# Patient Record
Sex: Female | Born: 1990
Health system: Southern US, Community
[De-identification: ages and names within clinical notes are randomized; demographics above are authoritative.]

## PROBLEM LIST (undated history)

## (undated) DIAGNOSIS — T8859XA Other complications of anesthesia, initial encounter: Secondary | ICD-10-CM

## (undated) DIAGNOSIS — T4145XA Adverse effect of unspecified anesthetic, initial encounter: Secondary | ICD-10-CM

## (undated) DIAGNOSIS — E039 Hypothyroidism, unspecified: Secondary | ICD-10-CM

## (undated) DIAGNOSIS — D6851 Activated protein C resistance: Secondary | ICD-10-CM

## (undated) DIAGNOSIS — J45909 Unspecified asthma, uncomplicated: Secondary | ICD-10-CM

## (undated) HISTORY — DX: Unspecified asthma, uncomplicated: J45.909

## (undated) HISTORY — PX: ABDOMINAL HYSTERECTOMY: SHX81

## (undated) HISTORY — PX: TYMPANOSTOMY TUBE PLACEMENT: SHX32

---
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
## 2011-11-08 HISTORY — PX: INGUINAL HERNIA REPAIR: SUR1180

## 2012-11-07 HISTORY — PX: COLONOSCOPY: SHX174

## 2016-08-25 DIAGNOSIS — Z23 Encounter for immunization: Secondary | ICD-10-CM | POA: Diagnosis not present

## 2016-08-25 DIAGNOSIS — R634 Abnormal weight loss: Secondary | ICD-10-CM | POA: Diagnosis not present

## 2016-08-25 DIAGNOSIS — R1114 Bilious vomiting: Secondary | ICD-10-CM | POA: Diagnosis not present

## 2016-08-25 DIAGNOSIS — E063 Autoimmune thyroiditis: Secondary | ICD-10-CM | POA: Diagnosis not present

## 2016-09-20 DIAGNOSIS — K219 Gastro-esophageal reflux disease without esophagitis: Secondary | ICD-10-CM | POA: Diagnosis not present

## 2016-10-14 DIAGNOSIS — N926 Irregular menstruation, unspecified: Secondary | ICD-10-CM | POA: Diagnosis not present

## 2017-03-09 DIAGNOSIS — J209 Acute bronchitis, unspecified: Secondary | ICD-10-CM | POA: Diagnosis not present

## 2017-03-09 DIAGNOSIS — E063 Autoimmune thyroiditis: Secondary | ICD-10-CM | POA: Diagnosis not present

## 2017-05-11 DIAGNOSIS — K529 Noninfective gastroenteritis and colitis, unspecified: Secondary | ICD-10-CM | POA: Diagnosis not present

## 2017-05-11 DIAGNOSIS — J4520 Mild intermittent asthma, uncomplicated: Secondary | ICD-10-CM | POA: Diagnosis not present

## 2017-05-11 DIAGNOSIS — E063 Autoimmune thyroiditis: Secondary | ICD-10-CM | POA: Diagnosis not present

## 2017-05-11 DIAGNOSIS — J302 Other seasonal allergic rhinitis: Secondary | ICD-10-CM | POA: Diagnosis not present

## 2017-11-13 DIAGNOSIS — Z0000 Encounter for general adult medical examination without abnormal findings: Secondary | ICD-10-CM | POA: Diagnosis not present

## 2017-11-13 DIAGNOSIS — E063 Autoimmune thyroiditis: Secondary | ICD-10-CM | POA: Diagnosis not present

## 2017-11-13 DIAGNOSIS — Z23 Encounter for immunization: Secondary | ICD-10-CM | POA: Diagnosis not present

## 2017-11-13 DIAGNOSIS — Z13220 Encounter for screening for lipoid disorders: Secondary | ICD-10-CM | POA: Diagnosis not present

## 2017-12-07 DIAGNOSIS — N94819 Vulvodynia, unspecified: Secondary | ICD-10-CM | POA: Diagnosis not present

## 2017-12-07 DIAGNOSIS — Z01411 Encounter for gynecological examination (general) (routine) with abnormal findings: Secondary | ICD-10-CM | POA: Diagnosis not present

## 2017-12-07 DIAGNOSIS — N939 Abnormal uterine and vaginal bleeding, unspecified: Secondary | ICD-10-CM | POA: Diagnosis not present

## 2017-12-07 DIAGNOSIS — D6851 Activated protein C resistance: Secondary | ICD-10-CM | POA: Diagnosis not present

## 2018-01-24 DIAGNOSIS — N939 Abnormal uterine and vaginal bleeding, unspecified: Secondary | ICD-10-CM | POA: Diagnosis not present

## 2018-02-07 ENCOUNTER — Encounter (HOSPITAL_BASED_OUTPATIENT_CLINIC_OR_DEPARTMENT_OTHER): Payer: Self-pay | Admitting: *Deleted

## 2018-02-07 ENCOUNTER — Other Ambulatory Visit: Payer: Self-pay

## 2018-02-07 DIAGNOSIS — N926 Irregular menstruation, unspecified: Secondary | ICD-10-CM | POA: Diagnosis not present

## 2018-02-09 NOTE — H&P (Signed)
27yo G0 who presents for Hysteroscopy, D&C, possible myosure resection.  In review, she reports that for as long as she can remember, her periods have been irregular. Sometimes they will be every 2 weeks, sometimes every 4- 6weeks, it really just depends. However, over the past few months it seems to have gotten worse- with more frequent menses. Due to her h/o FVL, she had tried progesterone only pills and had a horrible reaction- noted worsening bleeding, bloating and weight gain. At this point, it has been something that she has just dealt with; however, recently she has been bleeding since November, which is the longest she has ever gone. Bleeding will maybe stop for 1-2 days, but mostly she will have moderate bleeding. The longest heavy bleeding last for about 2 weeks. 2) Dyspareunia: Pt reports long standing h/o pain with any type of penetration. Pain is mostly on the outside. She has never tried physical therapy or other conservative options. Denies vaginal discharge, itching or irritation.   Unable to complete SHG- could not get pass cervical os due to discomfort. US: 7.9cm anteverted uterus with thickened endometrium ?hyperechoic mass fundal wall 0.9cm, avascular. Bilateral ovaries unremarkable.  Current Medications  Taking   Allegra(Fexofenadine HCl) as needed   Albuterol Sulfate HFA 108 (90 Base) MCG/ACT Aerosol Solution 2 puffs as needed Inhalation every 4 hrs, Notes: prn   Pepcid AC(Famotidine) 10 MG Tablet 1 tablet as needed Orally Twice a day   Synthroid(L-Thyroxine Sodium) 50 MCG Tablet 1 tablet on an empty stomach in the morning Orally Once a day   Xanax(ALPRAZolam) 0.25 MG Tablet 1 tablet Orally once   Unknown   Omeprazole 20 mg tablet one tablet orally twice a day   Medication List reviewed and reconciled with the patient    Past Medical History  Hypothryoidism.   Factor V Leiden.   Asthma.    Surgical History  wisdom teeth 2008  right inguinal hernia 2013   endoscopy/colonoscopy 2015   Family History  Father: high cholesterol, diagnosed with Hypertension  Mother: high cholesterol, , melanoma, Hypertension  Maternal Grand Father: MI (5650's)  Maternal Grand Mother: MI 63(50's)  Paternal Grand Mother: Lung Cancer  Negative for GI\\\/Cardiac\\\/GYN in immediate family.   Social History  General:  Tobacco use  cigarettes: Former smoker tobacco pipe- only smoked for a year Quit in year 2013 Tobacco history last updated 11/13/2017 Alcohol: Daily, beer.  Marital Status: Engaged on Christmas 2018. To Judeth CornfieldStephanie and denies abuse. .  Seat belt use: yes.    Gyn History  Sexual activity currently sexually active.  Periods : irregular, on going pd since November 2018.  LMP 09/18/2017 and hasn't stopped.  Birth control same sex relationship.  Last pap smear date Unsure.  Abnormal pap smear no.  STD none.  Menarche 12.    OB History  Never been pregnant per patient.    Allergies  Vicodin: stomach upset   Hospitalization/Major Diagnostic Procedure  Concussions - 6 in high school, 1 in college    Review of Systems  CONSTITUTIONAL:  no Fatigue. no Fever. no Weight gain. no Weight loss.  HEENT:  no nasal congestion. no Visual changes.  CARDIOLOGY:  no Chest pain. no Dyspnea on exertion. no Edema. no Palpitations.  RESPIRATORY:  no Shortness of breath. no Cough. no Hemoptysis.  UROLOGY:  no Pain with urination. no Urinary urgency. no Urinary frequency. no Urinary incontinence.  GASTROENTEROLOGY:  no Change in bowel habits. no Constipation. no Diarrhea. no Incontinence of stool.  FEMALE  REPRODUCTIVE:  no Abnormal vaginal bleeding. no Abnormal vaginal discharge. no Breast pain. no Breast tenderness. no Hot flashes. no Vaginal irritation. no Vaginal itching.  NEUROLOGY:  no Dizziness. no Fainting. no Headache.  PSYCHOLOGY:  no Anxiety. no Depression.  SKIN:  no Rash. no Hives.  ENDOCRINOLOGY:  no Cold intolerance. no Excessive thirst.  no Excessive urination.     O: Examination in office: General Examination: CONSTITUTIONAL: well developed and well nourished.  SKIN: moist, warm, normal, no rash.  HEENT: within normal limits.  NECK: normal appearance, no thyromegaly.  LUNGS: regular breathing rate and effort.  ABDOMEN: soft and not tender, no masses palpated, no rebound, no rigidity, no guarding.  FEMALE GENITOURINARY: normal external genitalia, no tenderness to external genitalia, +tenderness with insertion of speculum, cervix visualized,vagina - pink moist mucosa, no lesions or abnormal discharge,cervix - no discharge or lesions.  EXTREMITIES: no edema present.  NEUROLOGIC EXAM: alert and oriented.  LYMPH NODES: no axillary adenopathy, no inguinal adenopathy.   A/P: 27yo G0 who presents for hysteroscopy, D&C, possible myosure ablation due to AUB -NPO -LR @ 125cc/hr -SCDs to OR -Risk/benefit and alternatives reviewed including but not limited to risk of bleeding, infection and uterine perforation.  Questions and concerns were addressed and she desires to proceed  Myna Hidalgo, DO [phone] (cell) [phone] (office)

## 2018-02-13 ENCOUNTER — Encounter (HOSPITAL_BASED_OUTPATIENT_CLINIC_OR_DEPARTMENT_OTHER)
Admission: RE | Admit: 2018-02-13 | Discharge: 2018-02-13 | Disposition: A | Discharge status: Home / Self Care | Payer: BLUE CROSS/BLUE SHIELD | Source: Ambulatory Visit | Attending: Obstetrics & Gynecology | Admitting: Obstetrics & Gynecology

## 2018-02-13 DIAGNOSIS — Z79899 Other long term (current) drug therapy: Secondary | ICD-10-CM | POA: Diagnosis not present

## 2018-02-13 DIAGNOSIS — Z885 Allergy status to narcotic agent status: Secondary | ICD-10-CM | POA: Diagnosis not present

## 2018-02-13 DIAGNOSIS — J45909 Unspecified asthma, uncomplicated: Secondary | ICD-10-CM | POA: Diagnosis not present

## 2018-02-13 DIAGNOSIS — D6851 Activated protein C resistance: Secondary | ICD-10-CM | POA: Diagnosis not present

## 2018-02-13 DIAGNOSIS — N939 Abnormal uterine and vaginal bleeding, unspecified: Secondary | ICD-10-CM | POA: Diagnosis not present

## 2018-02-13 DIAGNOSIS — Z87891 Personal history of nicotine dependence: Secondary | ICD-10-CM | POA: Diagnosis not present

## 2018-02-13 DIAGNOSIS — E039 Hypothyroidism, unspecified: Secondary | ICD-10-CM | POA: Diagnosis not present

## 2018-02-13 DIAGNOSIS — N840 Polyp of corpus uteri: Secondary | ICD-10-CM | POA: Diagnosis not present

## 2018-02-13 LAB — POCT PREGNANCY, URINE: Preg Test, Ur: NEGATIVE

## 2018-02-13 LAB — BASIC METABOLIC PANEL
Anion gap: 10 (ref 5–15)
BUN: 12 mg/dL (ref 6–20)
CO2: 23 mmol/L (ref 22–32)
Calcium: 9.5 mg/dL (ref 8.9–10.3)
Chloride: 103 mmol/L (ref 101–111)
Creatinine, Ser: 0.85 mg/dL (ref 0.44–1.00)
GFR calc Af Amer: >60 mL/min (ref >60)
GFR calc non Af Amer: >60 mL/min (ref >60)
Glucose, Bld: 89 mg/dL (ref 65–99)
Potassium: 4.6 mmol/L (ref 3.5–5.1)
Sodium: 136 mmol/L (ref 135–145)

## 2018-02-13 LAB — CBC
HCT: 42 % (ref 36.0–46.0)
Hemoglobin: 14.1 g/dL (ref 12.0–15.0)
MCH: 32.1 pg (ref 26.0–34.0)
MCHC: 33.6 g/dL (ref 30.0–36.0)
MCV: 95.7 fL (ref 78.0–100.0)
Platelets: 240 10*3/uL (ref 150–400)
RBC: 4.39 MIL/uL (ref 3.87–5.11)
RDW: 12.1 % (ref 11.5–15.5)
WBC: 6.9 10*3/uL (ref 4.0–10.5)

## 2018-02-14 ENCOUNTER — Ambulatory Visit (HOSPITAL_BASED_OUTPATIENT_CLINIC_OR_DEPARTMENT_OTHER): Payer: BLUE CROSS/BLUE SHIELD | Admitting: Anesthesiology

## 2018-02-14 ENCOUNTER — Ambulatory Visit (HOSPITAL_BASED_OUTPATIENT_CLINIC_OR_DEPARTMENT_OTHER)
Admission: RE | Admit: 2018-02-14 | Discharge: 2018-02-14 | Disposition: A | Discharge status: Home / Self Care | Payer: BLUE CROSS/BLUE SHIELD | Source: Ambulatory Visit | Attending: Obstetrics & Gynecology | Admitting: Obstetrics & Gynecology

## 2018-02-14 ENCOUNTER — Encounter (HOSPITAL_BASED_OUTPATIENT_CLINIC_OR_DEPARTMENT_OTHER)
Admission: RE | Disposition: A | Discharge status: Home / Self Care | Payer: Self-pay | Source: Ambulatory Visit | Attending: Obstetrics & Gynecology

## 2018-02-14 ENCOUNTER — Encounter (HOSPITAL_BASED_OUTPATIENT_CLINIC_OR_DEPARTMENT_OTHER): Payer: Self-pay | Admitting: Anesthesiology

## 2018-02-14 DIAGNOSIS — Z79899 Other long term (current) drug therapy: Secondary | ICD-10-CM | POA: Insufficient documentation

## 2018-02-14 DIAGNOSIS — E039 Hypothyroidism, unspecified: Secondary | ICD-10-CM | POA: Insufficient documentation

## 2018-02-14 DIAGNOSIS — N939 Abnormal uterine and vaginal bleeding, unspecified: Secondary | ICD-10-CM | POA: Diagnosis not present

## 2018-02-14 DIAGNOSIS — J45909 Unspecified asthma, uncomplicated: Secondary | ICD-10-CM | POA: Insufficient documentation

## 2018-02-14 DIAGNOSIS — N840 Polyp of corpus uteri: Secondary | ICD-10-CM | POA: Insufficient documentation

## 2018-02-14 DIAGNOSIS — Z885 Allergy status to narcotic agent status: Secondary | ICD-10-CM | POA: Diagnosis not present

## 2018-02-14 DIAGNOSIS — Z87891 Personal history of nicotine dependence: Secondary | ICD-10-CM | POA: Diagnosis not present

## 2018-02-14 DIAGNOSIS — D6851 Activated protein C resistance: Secondary | ICD-10-CM | POA: Insufficient documentation

## 2018-02-14 HISTORY — DX: Hypothyroidism, unspecified: E03.9

## 2018-02-14 HISTORY — DX: Adverse effect of unspecified anesthetic, initial encounter: T41.45XA

## 2018-02-14 HISTORY — DX: Other complications of anesthesia, initial encounter: T88.59XA

## 2018-02-14 HISTORY — PX: DILATATION & CURETTAGE/HYSTEROSCOPY WITH MYOSURE: SHX6511

## 2018-02-14 HISTORY — DX: Activated protein C resistance: D68.51

## 2018-02-14 SURGERY — DILATATION & CURETTAGE/HYSTEROSCOPY WITH MYOSURE
Anesthesia: General

## 2018-02-14 MED ORDER — PROPOFOL 10 MG/ML IV BOLUS
INTRAVENOUS | Status: AC
  Filled 2018-02-14: qty 20

## 2018-02-14 MED ORDER — PROPOFOL 10 MG/ML IV BOLUS
INTRAVENOUS | Status: DC | PRN
  Administered 2018-02-14: 150 mg via INTRAVENOUS

## 2018-02-14 MED ORDER — FENTANYL CITRATE (PF) 100 MCG/2ML IJ SOLN
25.0000 ug | INTRAMUSCULAR | Status: DC | PRN
  Administered 2018-02-14: 50 ug via INTRAVENOUS

## 2018-02-14 MED ORDER — ONDANSETRON HCL 4 MG/2ML IJ SOLN
INTRAMUSCULAR | Status: DC | PRN
  Administered 2018-02-14: 4 mg via INTRAVENOUS

## 2018-02-14 MED ORDER — LIDOCAINE HCL (CARDIAC) 20 MG/ML IV SOLN
INTRAVENOUS | Status: DC | PRN
  Administered 2018-02-14: 60 mg via INTRAVENOUS

## 2018-02-14 MED ORDER — SILVER NITRATE-POT NITRATE 75-25 % EX MISC
CUTANEOUS | Status: AC
  Filled 2018-02-14: qty 4

## 2018-02-14 MED ORDER — LACTATED RINGERS IV SOLN: INTRAVENOUS | Status: DC

## 2018-02-14 MED ORDER — FENTANYL CITRATE (PF) 100 MCG/2ML IJ SOLN
INTRAMUSCULAR | Status: AC
  Filled 2018-02-14: qty 2

## 2018-02-14 MED ORDER — FENTANYL CITRATE (PF) 100 MCG/2ML IJ SOLN
INTRAMUSCULAR | Status: DC | PRN
  Administered 2018-02-14 (×2): 50 ug via INTRAVENOUS

## 2018-02-14 MED ORDER — MIDAZOLAM HCL 2 MG/2ML IJ SOLN
INTRAMUSCULAR | Status: AC
  Filled 2018-02-14: qty 2

## 2018-02-14 MED ORDER — ONDANSETRON HCL 4 MG/2ML IJ SOLN
INTRAMUSCULAR | Status: AC
  Filled 2018-02-14: qty 2

## 2018-02-14 MED ORDER — SODIUM CHLORIDE 0.9 % IR SOLN
Status: DC | PRN
  Administered 2018-02-14: 3000 mL

## 2018-02-14 MED ORDER — LIDOCAINE-EPINEPHRINE 1 %-1:100000 IJ SOLN
INTRAMUSCULAR | Status: DC | PRN
  Administered 2018-02-14: 20 mL

## 2018-02-14 MED ORDER — LACTATED RINGERS IV SOLN
INTRAVENOUS | Status: DC
  Administered 2018-02-14 (×2): via INTRAVENOUS

## 2018-02-14 MED ORDER — LIDOCAINE-EPINEPHRINE 1 %-1:100000 IJ SOLN
INTRAMUSCULAR | Status: AC
  Filled 2018-02-14: qty 1

## 2018-02-14 MED ORDER — GLYCOPYRROLATE 0.2 MG/ML IJ SOLN
INTRAMUSCULAR | Status: DC | PRN
  Administered 2018-02-14: 0.2 mg via INTRAVENOUS

## 2018-02-14 MED ORDER — SCOPOLAMINE 1 MG/3DAYS TD PT72: 1.0000 | MEDICATED_PATCH | Freq: Once | TRANSDERMAL | Status: DC | PRN

## 2018-02-14 MED ORDER — MIDAZOLAM HCL 5 MG/5ML IJ SOLN
INTRAMUSCULAR | Status: DC | PRN
  Administered 2018-02-14: 2 mg via INTRAVENOUS

## 2018-02-14 MED ORDER — KETOROLAC TROMETHAMINE 30 MG/ML IJ SOLN
INTRAMUSCULAR | Status: AC
  Filled 2018-02-14: qty 1

## 2018-02-14 MED ORDER — FENTANYL CITRATE (PF) 100 MCG/2ML IJ SOLN: 50.0000 ug | INTRAMUSCULAR | Status: DC | PRN

## 2018-02-14 MED ORDER — DEXAMETHASONE SODIUM PHOSPHATE 10 MG/ML IJ SOLN
INTRAMUSCULAR | Status: DC | PRN
  Administered 2018-02-14: 10 mg via INTRAVENOUS

## 2018-02-14 MED ORDER — DEXAMETHASONE SODIUM PHOSPHATE 10 MG/ML IJ SOLN
INTRAMUSCULAR | Status: AC
  Filled 2018-02-14: qty 1

## 2018-02-14 MED ORDER — LIDOCAINE HCL (CARDIAC) 20 MG/ML IV SOLN
INTRAVENOUS | Status: AC
  Filled 2018-02-14: qty 5

## 2018-02-14 MED ORDER — MIDAZOLAM HCL 2 MG/2ML IJ SOLN: 1.0000 mg | INTRAMUSCULAR | Status: DC | PRN

## 2018-02-14 MED ORDER — KETOROLAC TROMETHAMINE 30 MG/ML IJ SOLN
INTRAMUSCULAR | Status: DC | PRN
  Administered 2018-02-14: 30 mg via INTRAVENOUS

## 2018-02-14 SURGICAL SUPPLY — 20 items
BRIEF STRETCH FOR OB PAD XXL (UNDERPADS AND DIAPERS) ×2 IMPLANT
CANISTER AQUILEX FLUID KIT (CANNISTER) ×2 IMPLANT
CATH ROBINSON RED A/P 16FR (CATHETERS) ×2 IMPLANT
DEVICE MYOSURE LITE (MISCELLANEOUS) ×2 IMPLANT
DEVICE MYOSURE REACH (MISCELLANEOUS) IMPLANT
DILATOR CANAL MILEX (MISCELLANEOUS) IMPLANT
GLOVE BIOGEL PI IND STRL 6.5 (GLOVE) ×1 IMPLANT
GLOVE BIOGEL PI IND STRL 7.0 (GLOVE) ×1 IMPLANT
GLOVE BIOGEL PI INDICATOR 6.5 (GLOVE) ×1
GLOVE BIOGEL PI INDICATOR 7.0 (GLOVE) ×1
GLOVE ECLIPSE 6.5 STRL STRAW (GLOVE) ×2 IMPLANT
GOWN STRL REUS W/TWL LRG LVL3 (GOWN DISPOSABLE) ×4 IMPLANT
PACK VAGINAL MINOR WOMEN LF (CUSTOM PROCEDURE TRAY) ×2 IMPLANT
PAD OB MATERNITY 4.3X12.25 (PERSONAL CARE ITEMS) ×2 IMPLANT
PAD PREP 24X48 CUFFED NSTRL (MISCELLANEOUS) ×2 IMPLANT
SEAL ROD LENS SCOPE MYOSURE (ABLATOR) ×2 IMPLANT
SLEEVE SCD COMPRESS KNEE MED (MISCELLANEOUS) ×2 IMPLANT
TOWEL OR 17X24 6PK STRL BLUE (TOWEL DISPOSABLE) ×4 IMPLANT
TUBING AQUILEX INFLOW (TUBING) ×2 IMPLANT
TUBING AQUILEX OUTFLOW (TUBING) ×2 IMPLANT

## 2018-02-14 NOTE — Transfer of Care (Signed)
Immediate Anesthesia Transfer of Care Note  Patient: Tracy Knapp  Procedure(s) Performed: DILATATION & CURETTAGE/HYSTEROSCOPY WITH MYOSURE (N/A )  Patient Location: PACU  Anesthesia Type:General  Level of Consciousness: awake, alert  and oriented  Airway & Oxygen Therapy: Patient Spontanous Breathing and Patient connected to face mask oxygen  Post-op Assessment: Report given to RN and Post -op Vital signs reviewed and stable  Post vital signs: Reviewed and stable  Last Vitals:  Vitals Value Taken Time  BP    Temp    Pulse    Resp    SpO2      Last Pain:  Vitals:   02/14/18 1327  PainSc: 0-No pain         Complications: No apparent anesthesia complications

## 2018-02-14 NOTE — Op Note (Signed)
Operative Report  PreOp: Abnormal uterine bleeding, possible uterine polyp PostOp: same Procedure:  Hysteroscopy, Dilation and Curettage, Myosure resection Surgeon: Dr. Myna HidalgoJennifer Megumi Treaster Anesthesia: General Complications:none EBL: 5ml UOP: 5cc IVF:500cc Discrepancy: 130cc  Findings:8cm anteverted uterus, both ostia visualized bilaterally, thickened endometrium with anterior 1cm uterine polyp seen Specimens: endometrial curetting and polyp   Procedure: The patient was taken to the operating room where she underwent general anesthesia without difficulty. The patient was placed in a low lithotomy position using Allen stirrups. The patient was examined with the findings as noted above.  She was then prepped and draped in the normal sterile fashion. The bladder was drained using a red rubber urethral catheter. A sterile speculum was inserted into the vagina. Cervical block was performed using 20cc of Lidocaine with epi.  A single tooth tenaculum was placed on the anterior lip of the cervix. The uterus was then sounded to 8cm. The endocervical canal was then serially dilated to 14French using Hank dilators.  The diagnostic hysteroscope was then inserted without difficulty and noted to have the findings as listed above. Visualization was achieved using NS as a distending medium. The myosure was then inserted for resection of the polyp and endometrium.  The hysteroscope was removed and sharp curettage was performed. The tissue was sent to pathology. Upon completion, the hysteroscope was reinserted, no uterine perforation was seen. All instrument were then removed. Hemostasis was observed at the cervical site. The patient was repositioned to the supine position. The patient tolerated the procedure without any complications and taken to recovery in stable condition.   Myna HidalgoJennifer Florene Brill, DO [phone] (pager) [phone] (office)

## 2018-02-14 NOTE — Discharge Instructions (Addendum)
°  HOME INSTRUCTIONS  Please note any unusual or excessive bleeding, pain, swelling. Mild dizziness or drowsiness are normal for about 24 hours after surgery.   Shower when comfortable  Restrictions: No driving for 24 hours or while taking pain medications.  Activity:  No heavy lifting (> 10 lbs), nothing in vagina (no tampons, douching, or intercourse) x 1 weeks; no tub baths for 1 weeks Vaginal spotting is expected but if your bleeding is heavy, period like,  please call the office  Diet:  You may return to your regular diet.  Do not eat large meals.  Eat small frequent meals throughout the day.  Continue to drink a good amount of water at least 6-8 glasses of water per day, hydration is very important for the healing process.  Pain Management: Take Motrin and or Tylenol as needed  Always take prescription pain medication with food, it may cause constipation, increase fluids and fiber and you may want to take an over-the-counter stool softener like Colace as needed up to 2x a day.    Alcohol -- Avoid for 24 hours and while taking pain medications.  Nausea: Take sips of ginger ale or soda  Fever -- Call physician if temperature over 101 degrees  Follow up:  If you do not already have a follow up appointment scheduled, please call the office at [phone].  If you experience fever (a temperature greater than 100.4), pain unrelieved by pain medication, shortness of breath, swelling of a single leg, or any other symptoms which are concerning to you please the office immediately.    Post Anesthesia Home Care Instructions  Activity: Get plenty of rest for the remainder of the day. A responsible individual must stay with you for 24 hours following the procedure.  For the next 24 hours, DO NOT: -Drive a car -Advertising copywriterperate machinery -Drink alcoholic beverages -Take any medication unless instructed by your physician -Make any legal decisions or sign important papers.  Meals: Start with  liquid foods such as gelatin or soup. Progress to regular foods as tolerated. Avoid greasy, spicy, heavy foods. If nausea and/or vomiting occur, drink only clear liquids until the nausea and/or vomiting subsides. Call your physician if vomiting continues.  Special Instructions/Symptoms: Your throat may feel dry or sore from the anesthesia or the breathing tube placed in your throat during surgery. If this causes discomfort, gargle with warm salt water. The discomfort should disappear within 24 hours.  If you had a scopolamine patch placed behind your ear for the management of post- operative nausea and/or vomiting:  1. The medication in the patch is effective for 72 hours, after which it should be removed.  Wrap patch in a tissue and discard in the trash. Wash hands thoroughly with soap and water. 2. You may remove the patch earlier than 72 hours if you experience unpleasant side effects which may include dry mouth, dizziness or visual disturbances. 3. Avoid touching the patch. Wash your hands with soap and water after contact with the patch.

## 2018-02-14 NOTE — Anesthesia Preprocedure Evaluation (Addendum)
Anesthesia Evaluation  Patient identified by MRN, date of birth, ID band Patient awake    Reviewed: Allergy & Precautions, H&P , NPO status , Patient's Chart, lab work & pertinent test results  Airway Mallampati: II  TM Distance: >3 FB Neck ROM: Full    Dental no notable dental hx. (+) Teeth Intact, Dental Advisory Given   Pulmonary neg pulmonary ROS, former smoker,    Pulmonary exam normal breath sounds clear to auscultation       Cardiovascular negative cardio ROS   Rhythm:Regular Rate:Normal     Neuro/Psych negative neurological ROS  negative psych ROS   GI/Hepatic negative GI ROS, Neg liver ROS,   Endo/Other  Hypothyroidism   Renal/GU negative Renal ROS  negative genitourinary   Musculoskeletal   Abdominal   Peds  Hematology negative hematology ROS (+)   Anesthesia Other Findings   Reproductive/Obstetrics negative OB ROS                            Anesthesia Physical Anesthesia Plan  ASA: II  Anesthesia Plan: General   Post-op Pain Management:    Induction: Intravenous  PONV Risk Score and Plan: 4 or greater and Ondansetron, Dexamethasone and Midazolam  Airway Management Planned: LMA  Additional Equipment:   Intra-op Plan:   Post-operative Plan: Extubation in OR  Informed Consent: I have reviewed the patients History and Physical, chart, labs and discussed the procedure including the risks, benefits and alternatives for the proposed anesthesia with the patient or authorized representative who has indicated his/her understanding and acceptance.   Dental advisory given  Plan Discussed with: CRNA  Anesthesia Plan Comments:         Anesthesia Quick Evaluation

## 2018-02-14 NOTE — Anesthesia Procedure Notes (Signed)
Procedure Name: LMA Insertion Date/Time: 02/14/2018 1:29 PM Performed by: Shanon PayorGregory, Legrand Lasser M, CRNA Pre-anesthesia Checklist: Patient identified, Emergency Drugs available, Suction available, Patient being monitored and Timeout performed Patient Re-evaluated:Patient Re-evaluated prior to induction Oxygen Delivery Method: Circle system utilized Preoxygenation: Pre-oxygenation with 100% oxygen Induction Type: IV induction LMA: LMA inserted LMA Size: 4.0 Number of attempts: 1 Placement Confirmation: positive ETCO2 and breath sounds checked- equal and bilateral Tube secured with: Tape Dental Injury: Teeth and Oropharynx as per pre-operative assessment

## 2018-02-14 NOTE — Interval H&P Note (Signed)
History and Physical Interval Note:  02/14/2018 12:58 PM  Tracy Knapp  has presented today for surgery, with the diagnosis of Abnormal uterine bleeding N93.9 Uterine polyp N84.0  The various methods of treatment have been discussed with the patient and family. After consideration of risks, benefits and other options for treatment, the patient has consented to  Procedure(s): DILATATION & CURETTAGE/HYSTEROSCOPY WITH MYOSURE (N/A) as a surgical intervention .  The patient's history has been reviewed, patient examined, no change in status, stable for surgery.  I have reviewed the patient's chart and labs.  Questions were answered to the patient's satisfaction.     Sharon SellerJennifer M Faolan [place]

## 2018-02-14 NOTE — Anesthesia Postprocedure Evaluation (Signed)
Anesthesia Post Note  Patient: Tracy Knapp  Procedure(s) Performed: DILATATION & CURETTAGE/HYSTEROSCOPY WITH MYOSURE (N/A )     Patient location during evaluation: PACU Anesthesia Type: General Level of consciousness: awake and alert Pain management: pain level controlled Vital Signs Assessment: post-procedure vital signs reviewed and stable Respiratory status: spontaneous breathing, nonlabored ventilation and respiratory function stable Cardiovascular status: blood pressure returned to baseline and stable Postop Assessment: no apparent nausea or vomiting Anesthetic complications: no    Last Vitals:  Vitals:   02/14/18 1415 02/14/18 1430  BP: 120/74 114/63  Pulse: 68 73  Resp: (!) 8 18  Temp:    SpO2: 100% 98%    Last Pain:  Vitals:   02/14/18 1430  PainSc: 1                  Jule Whitsel A.

## 2018-02-15 ENCOUNTER — Encounter (HOSPITAL_BASED_OUTPATIENT_CLINIC_OR_DEPARTMENT_OTHER): Payer: Self-pay | Admitting: Obstetrics & Gynecology

## 2018-03-05 DIAGNOSIS — N926 Irregular menstruation, unspecified: Secondary | ICD-10-CM | POA: Diagnosis not present

## 2018-03-13 DIAGNOSIS — Z579 Occupational exposure to unspecified risk factor: Secondary | ICD-10-CM | POA: Diagnosis not present

## 2018-07-02 DIAGNOSIS — F419 Anxiety disorder, unspecified: Secondary | ICD-10-CM | POA: Diagnosis not present

## 2018-07-16 DIAGNOSIS — F419 Anxiety disorder, unspecified: Secondary | ICD-10-CM | POA: Diagnosis not present

## 2018-08-06 DIAGNOSIS — F419 Anxiety disorder, unspecified: Secondary | ICD-10-CM | POA: Diagnosis not present

## 2018-10-08 DIAGNOSIS — N926 Irregular menstruation, unspecified: Secondary | ICD-10-CM | POA: Diagnosis not present

## 2018-10-17 DIAGNOSIS — F419 Anxiety disorder, unspecified: Secondary | ICD-10-CM | POA: Diagnosis not present

## 2018-11-12 DIAGNOSIS — F419 Anxiety disorder, unspecified: Secondary | ICD-10-CM | POA: Diagnosis not present

## 2018-12-05 DIAGNOSIS — E063 Autoimmune thyroiditis: Secondary | ICD-10-CM | POA: Diagnosis not present

## 2018-12-05 DIAGNOSIS — Z0000 Encounter for general adult medical examination without abnormal findings: Secondary | ICD-10-CM | POA: Diagnosis not present

## 2018-12-10 DIAGNOSIS — F419 Anxiety disorder, unspecified: Secondary | ICD-10-CM | POA: Diagnosis not present

## 2019-01-02 DIAGNOSIS — F419 Anxiety disorder, unspecified: Secondary | ICD-10-CM | POA: Diagnosis not present

## 2019-01-14 DIAGNOSIS — F419 Anxiety disorder, unspecified: Secondary | ICD-10-CM | POA: Diagnosis not present

## 2019-02-04 DIAGNOSIS — F419 Anxiety disorder, unspecified: Secondary | ICD-10-CM | POA: Diagnosis not present

## 2019-02-25 DIAGNOSIS — F419 Anxiety disorder, unspecified: Secondary | ICD-10-CM | POA: Diagnosis not present

## 2019-04-22 DIAGNOSIS — F419 Anxiety disorder, unspecified: Secondary | ICD-10-CM | POA: Diagnosis not present

## 2019-04-24 DIAGNOSIS — Z9229 Personal history of other drug therapy: Secondary | ICD-10-CM | POA: Diagnosis not present

## 2019-05-22 DIAGNOSIS — F419 Anxiety disorder, unspecified: Secondary | ICD-10-CM | POA: Diagnosis not present

## 2019-06-19 DIAGNOSIS — F419 Anxiety disorder, unspecified: Secondary | ICD-10-CM | POA: Diagnosis not present

## 2019-07-10 DIAGNOSIS — J029 Acute pharyngitis, unspecified: Secondary | ICD-10-CM | POA: Diagnosis not present

## 2019-07-17 DIAGNOSIS — F419 Anxiety disorder, unspecified: Secondary | ICD-10-CM | POA: Diagnosis not present

## 2019-08-07 DIAGNOSIS — F419 Anxiety disorder, unspecified: Secondary | ICD-10-CM | POA: Diagnosis not present

## 2019-08-28 DIAGNOSIS — F419 Anxiety disorder, unspecified: Secondary | ICD-10-CM | POA: Diagnosis not present

## 2019-09-12 ENCOUNTER — Other Ambulatory Visit: Payer: Self-pay

## 2019-09-12 DIAGNOSIS — Z20822 Contact with and (suspected) exposure to covid-19: Secondary | ICD-10-CM

## 2019-09-14 LAB — NOVEL CORONAVIRUS, NAA: SARS-CoV-2, NAA: NOT DETECTED

## 2019-09-16 DIAGNOSIS — H6980 Other specified disorders of Eustachian tube, unspecified ear: Secondary | ICD-10-CM | POA: Diagnosis not present

## 2019-09-25 DIAGNOSIS — F419 Anxiety disorder, unspecified: Secondary | ICD-10-CM | POA: Diagnosis not present

## 2019-10-18 ENCOUNTER — Other Ambulatory Visit: Payer: Self-pay

## 2019-10-18 DIAGNOSIS — Z20822 Contact with and (suspected) exposure to covid-19: Secondary | ICD-10-CM

## 2019-10-20 LAB — NOVEL CORONAVIRUS, NAA: SARS-CoV-2, NAA: DETECTED — AB

## 2019-10-22 ENCOUNTER — Emergency Department (HOSPITAL_COMMUNITY): Payer: BC Managed Care – PPO

## 2019-10-22 ENCOUNTER — Other Ambulatory Visit: Payer: Self-pay

## 2019-10-22 ENCOUNTER — Emergency Department (HOSPITAL_COMMUNITY)
Admission: EM | Admit: 2019-10-22 | Discharge: 2019-10-23 | Disposition: A | Discharge status: Home / Self Care | Payer: BC Managed Care – PPO | Attending: Emergency Medicine | Admitting: Emergency Medicine

## 2019-10-22 DIAGNOSIS — U071 COVID-19: Secondary | ICD-10-CM | POA: Insufficient documentation

## 2019-10-22 DIAGNOSIS — R0602 Shortness of breath: Secondary | ICD-10-CM | POA: Insufficient documentation

## 2019-10-22 DIAGNOSIS — R0789 Other chest pain: Secondary | ICD-10-CM

## 2019-10-22 DIAGNOSIS — E039 Hypothyroidism, unspecified: Secondary | ICD-10-CM | POA: Insufficient documentation

## 2019-10-22 DIAGNOSIS — Z79899 Other long term (current) drug therapy: Secondary | ICD-10-CM | POA: Diagnosis not present

## 2019-10-22 DIAGNOSIS — R509 Fever, unspecified: Secondary | ICD-10-CM | POA: Diagnosis not present

## 2019-10-22 DIAGNOSIS — Z87891 Personal history of nicotine dependence: Secondary | ICD-10-CM | POA: Insufficient documentation

## 2019-10-22 LAB — CBC WITH DIFFERENTIAL/PLATELET
Abs Immature Granulocytes: 0.01 10*3/uL (ref 0.00–0.07)
Basophils Absolute: 0 10*3/uL (ref 0.0–0.1)
Basophils Relative: 0 %
Eosinophils Absolute: 0 10*3/uL (ref 0.0–0.5)
Eosinophils Relative: 0 %
HCT: 39.4 % (ref 36.0–46.0)
Hemoglobin: 13.3 g/dL (ref 12.0–15.0)
Immature Granulocytes: 0 %
Lymphocytes Relative: 24 %
Lymphs Abs: 1.1 10*3/uL (ref 0.7–4.0)
MCH: 31.9 pg (ref 26.0–34.0)
MCHC: 33.8 g/dL (ref 30.0–36.0)
MCV: 94.5 fL (ref 80.0–100.0)
Monocytes Absolute: 0.5 10*3/uL (ref 0.1–1.0)
Monocytes Relative: 10 %
Neutro Abs: 3 10*3/uL (ref 1.7–7.7)
Neutrophils Relative %: 66 %
Platelets: 156 10*3/uL (ref 150–400)
RBC: 4.17 MIL/uL (ref 3.87–5.11)
RDW: 11.9 % (ref 11.5–15.5)
WBC: 4.6 10*3/uL (ref 4.0–10.5)
nRBC: 0 % (ref 0.0–0.2)

## 2019-10-22 LAB — COMPREHENSIVE METABOLIC PANEL
ALT: 15 U/L (ref 0–44)
AST: 29 U/L (ref 15–41)
Albumin: 4.2 g/dL (ref 3.5–5.0)
Alkaline Phosphatase: 41 U/L (ref 38–126)
Anion gap: 10 (ref 5–15)
BUN: 11 mg/dL (ref 6–20)
CO2: 25 mmol/L (ref 22–32)
Calcium: 9.2 mg/dL (ref 8.9–10.3)
Chloride: 105 mmol/L (ref 98–111)
Creatinine, Ser: 0.94 mg/dL (ref 0.44–1.00)
GFR calc Af Amer: >60 mL/min (ref >60)
GFR calc non Af Amer: >60 mL/min (ref >60)
Glucose, Bld: 95 mg/dL (ref 70–99)
Potassium: 4.2 mmol/L (ref 3.5–5.1)
Sodium: 140 mmol/L (ref 135–145)
Total Bilirubin: 0.2 mg/dL — ABNORMAL LOW (ref 0.3–1.2)
Total Protein: 7.1 g/dL (ref 6.5–8.1)

## 2019-10-22 LAB — TROPONIN I (HIGH SENSITIVITY)
Troponin I (High Sensitivity): 5 ng/L (ref <18)
Troponin I (High Sensitivity): 4 ng/L (ref <18)

## 2019-10-22 LAB — I-STAT BETA HCG BLOOD, ED (MC, WL, AP ONLY): I-stat hCG, quantitative: <5 m[IU]/mL (ref <5)

## 2019-10-22 LAB — BRAIN NATRIURETIC PEPTIDE: B Natriuretic Peptide: 19.1 pg/mL (ref 0.0–100.0)

## 2019-10-22 MED ORDER — IOHEXOL 350 MG/ML SOLN
75.0000 mL | Freq: Once | INTRAVENOUS | Status: AC | PRN
  Administered 2019-10-22: 75 mL via INTRAVENOUS

## 2019-10-22 MED ORDER — ACETAMINOPHEN 500 MG PO TABS
1000.0000 mg | ORAL_TABLET | Freq: Once | ORAL | Status: AC
  Administered 2019-10-22: 1000 mg via ORAL
  Filled 2019-10-22: qty 2

## 2019-10-22 NOTE — ED Triage Notes (Signed)
Pt reports testing positive for covid 4 days ago.  Pt reports high fever and tachycardia.  HR is 87 and temp is 98.7 at this time.  Pt was told to come by PCP.  Pt reports hx of blood clot disorder

## 2019-10-23 DIAGNOSIS — J4521 Mild intermittent asthma with (acute) exacerbation: Secondary | ICD-10-CM | POA: Diagnosis not present

## 2019-10-23 DIAGNOSIS — U071 COVID-19: Secondary | ICD-10-CM | POA: Diagnosis not present

## 2019-10-23 NOTE — ED Notes (Signed)
Patient verbalizes understanding of discharge instructions. Opportunity for questioning and answers were provided. Armband removed by staff, pt discharged from ED.  

## 2019-10-24 DIAGNOSIS — J4520 Mild intermittent asthma, uncomplicated: Secondary | ICD-10-CM | POA: Diagnosis not present

## 2019-10-24 DIAGNOSIS — J45901 Unspecified asthma with (acute) exacerbation: Secondary | ICD-10-CM | POA: Diagnosis not present

## 2019-10-24 NOTE — ED Provider Notes (Signed)
[facility] EMERGENCY DEPARTMENT Provider Note   CSN: [id number] Arrival date & time: 10/22/19  1810     History Chief Complaint  Patient presents with  . Chest Pain  . Shortness of Breath    Tracy Knapp is a 28 y.o. female.  HPI      28yo female vetrenarian with history of hypothyroidism, asthma, Factor V Leiden with no history of thromboembolism/not on anticoagulation (genetic mutation found after father diagnosed), recent COVID19 diagnosis, presents with concern for shortness of breath, chest tightness.  Reports she has had one week of symptoms including nausea, vomiting, diarrhea, body aches, loss of taste and smell, fatigue, decreased appetite, mild cough.  Tested positive for COVID 19 approximately 4 days ago. Wife has also tested positive.  For the last 2 days has began to feel increased shortness of breath and sensation of chest tightness. Feels that she cannot get a deep breath in.  Tried using inhaler but did not get relief.  Has pulse oximeter at home and notes saturations have been ok but has still felt breathless. Discussed with her PCP who recommended coming to ED given hx of factor V and COVID positive.     Past Medical History:  Diagnosis Date  . Complication of anesthesia    wakes up from surgery dysphoric  . Factor 5 Leiden mutation, heterozygous (HCC)   . Hypothyroidism     There are no problems to display for this patient.   Past Surgical History:  Procedure Laterality Date  . COLONOSCOPY  2014  . DILATATION & CURETTAGE/HYSTEROSCOPY WITH MYOSURE N/A 02/14/2018   Procedure: DILATATION & CURETTAGE/HYSTEROSCOPY WITH MYOSURE;  Surgeon: Myna Hidalgo, DO;  Location: [facility];  Service: Gynecology;  Laterality: N/A;  . INGUINAL HERNIA REPAIR  2013  . WISDOM TOOTH EXTRACTION  2008     OB History   No obstetric history on file.     No family history on file.  Social History   Tobacco Use  . Smoking status:  Former Games developer  . Smokeless tobacco: Never Used  Substance Use Topics  . Alcohol use: Yes    Comment: occas  . Drug use: Never    Home Medications Prior to Admission medications   Medication Sig Start Date End Date Taking? Authorizing Provider  albuterol (PROVENTIL) (2.5 MG/3ML) 0.083% nebulizer solution Take 2.5 mg by nebulization every 6 (six) hours as needed for wheezing or shortness of breath.    [provider]  fexofenadine (ALLEGRA) 180 MG tablet Take 180 mg by mouth daily.    [provider]  fluticasone (FLONASE) 50 MCG/ACT nasal spray Place into both nostrils daily.    [provider]  levothyroxine (SYNTHROID, LEVOTHROID) 50 MCG tablet Take 50 mcg by mouth daily before breakfast.    [provider]    Allergies    Vicodin [hydrocodone-acetaminophen]  Review of Systems   Review of Systems  Constitutional: Positive for appetite change, fatigue and fever.  Respiratory: Positive for cough, chest tightness and shortness of breath.   Cardiovascular: Positive for chest pain (tightness). Negative for leg swelling.  Gastrointestinal: Positive for diarrhea and nausea. Negative for abdominal pain and vomiting.  Genitourinary: Negative for difficulty urinating.  Musculoskeletal: Positive for arthralgias and myalgias.  Skin: Negative for rash.  Neurological: Negative for syncope and headaches.    Physical Exam Updated Vital Signs BP (!) 143/88   Pulse (!) 105   Temp 98.7 F (37.1 C) (Oral)   Resp 18  Ht 6\' 1"  (1.854 m)   Wt 77.1 kg   SpO2 95%   BMI 22.43 kg/m   Physical Exam Vitals and nursing note reviewed.  Constitutional:      General: She is not in acute distress.    Appearance: She is well-developed. She is ill-appearing. She is not toxic-appearing or diaphoretic.  HENT:     Head: Normocephalic and atraumatic.  Eyes:     Conjunctiva/sclera: Conjunctivae normal.  Cardiovascular:     Rate and Rhythm: Normal rate and regular  rhythm.     Heart sounds: Normal heart sounds. No murmur. No friction rub. No gallop.   Pulmonary:     Effort: Pulmonary effort is normal. No respiratory distress.     Breath sounds: Normal breath sounds. No wheezing or rales.  Abdominal:     General: There is no distension.     Palpations: Abdomen is soft.     Tenderness: There is no abdominal tenderness. There is no guarding.  Musculoskeletal:        General: No tenderness.     Cervical back: Normal range of motion.  Skin:    General: Skin is warm and dry.     Findings: No erythema or rash.  Neurological:     Mental Status: She is alert and oriented to person, place, and time.     ED Results / Procedures / Treatments   Labs (all labs ordered are listed, but only abnormal results are displayed) Labs Reviewed  COMPREHENSIVE METABOLIC PANEL - Abnormal; Notable for the following components:      Result Value   Total Bilirubin 0.2 (*)    All other components within normal limits  CBC WITH DIFFERENTIAL/PLATELET  BRAIN NATRIURETIC PEPTIDE  I-STAT BETA HCG BLOOD, ED ([facility], [facility], [facility] ONLY)  TROPONIN I (HIGH SENSITIVITY)  TROPONIN I (HIGH SENSITIVITY)    EKG EKG Interpretation  Date/Time:  Tuesday October 22 2019 19:19:13 EST Ventricular Rate:  92 PR Interval:    QRS Duration: 86 QT Interval:  350 QTC Calculation: 433 R Axis:   92 Text Interpretation: Sinus rhythm Short PR interval Borderline right axis deviation No previous ECGs available Confirmed by Gareth Morgan [phone]) on 10/22/2019 9:32:05 PM Also confirmed by Gareth Morgan [phone]), editor [place], [place] [phone])  on 10/23/2019 9:20:08 AM   Radiology CT Angio Chest PE W and/or Wo Contrast  Result Date: 10/22/2019 CLINICAL DATA:  Shortness of breath, COVID-19 positive, heterozygous factor V Leiden mutation EXAM: CT ANGIOGRAPHY CHEST WITH CONTRAST TECHNIQUE: Multidetector CT imaging of the chest was performed using the standard protocol during bolus administration  of intravenous contrast. Multiplanar CT image reconstructions and MIPs were obtained to evaluate the vascular anatomy. CONTRAST:  76mL OMNIPAQUE IOHEXOL 350 MG/ML SOLN COMPARISON:  Chest radiograph 10/22/2019 FINDINGS: Cardiovascular: Satisfactory opacification the pulmonary arteries to the segmental level. No pulmonary artery filling defects are identified. Central pulmonary arteries are normal caliber. Normal heart size. No pericardial effusion. Suboptimal opacification of the normal caliber aorta. No periaortic stranding or other acute aortic abnormality is evident. Shared origin of the brachiocephalic and left common carotid artery. Mediastinum/Nodes: Wedge-shaped soft tissue attenuation in the anterior mediastinum is most likely reflective of thymic remnant in a patient of this age given location, configuration, and absence of surrounding fat stranding or other adjacent traumatic features such as sternal fracture. No mediastinal fluid or gas. No mediastinal, hilar or axillary adenopathy. No acute abnormality of the trachea or esophagus. Included portions of the thyroid gland and thoracic inlet are  unremarkable. Lungs/Pleura: Patchy peripheral mixed consolidation and ground-glass opacities in the bilateral lower lobes and in the lingula. No pneumothorax. No effusion. No suspicious nodules or masses. Upper Abdomen: No acute abnormalities present in the visualized portions of the upper abdomen. Musculoskeletal: No chest wall abnormality. No acute osseous abnormality. Multilevel Schmorl's node formations are noted. Review of the MIP images confirms the above findings. IMPRESSION: 1. No evidence of pulmonary embolism. 2. Peripheral lower lobe and lingula consolidation and ground-glass opacity compatible with a multifocal pneumonia including potential viral etiology. 3. Wedge-shaped soft tissue attenuation in the anterior mediastinum is most likely reflective of thymic remnant in a patient of this age. Electronically  Signed   By: Kreg ShropshirePrice  DeHay M.D.   On: 10/22/2019 23:16   DG Chest Portable 1 View  Result Date: 10/22/2019 CLINICAL DATA:  COVID-19 positive patient reports a fever. EXAM: PORTABLE CHEST 1 VIEW COMPARISON:  None. FINDINGS: Patchy airspace disease is seen in the right lung base. The left lung is clear. No pneumothorax or pleural effusion. Heart size is normal. IMPRESSION: Patchy right basilar airspace disease worrisome for pneumonia including viral/atypical infection. Electronically Signed   By: Drusilla Kannerhomas  Dalessio M.D.   On: 10/22/2019 21:13    Procedures Procedures (including critical care time)  Medications Ordered in ED Medications  acetaminophen (TYLENOL) tablet 1,000 mg (1,000 mg Oral Given 10/22/19 2238)  iohexol (OMNIPAQUE) 350 MG/ML injection 75 mL (75 mLs Intravenous Contrast Given 10/22/19 2256)    ED Course  I have reviewed the triage vital signs and the nursing notes.  Pertinent labs & imaging results that were available during my care of the patient were reviewed by me and considered in my medical decision making (see chart for details).    MDM Rules/Calculators/A&P                      28yo female vetrenarian with history of hypothyroidism, asthma, Factor V Leiden with no history of thromboembolism/not on anticoagulation (genetic mutation found after father diagnosed), recent COVID19 diagnosis, presents with concern for shortness of breath, chest tightness.  Labs including delta troponins without significant findings. No sign of ACS, pericarditis, myocarditis, anemia.  Given risk factors for thromboembolism, obtained CT PE study which shows no evidence of PE but does show findings consistent with COVID 19 multifocal pneumonia.  In setting of known COVID diagnosis, doubt thtis represents bacterial pneumonia.  Exam not consistent with asthma exacerbation, PCP has written prednisone as outpatient which feel is not unreasonable but discussed that continued supportive care,  quarantine and monitoring of symptoms is appropriate.  Vital signs stable, normal oxygenation, patient appropriate for outpatient management of COVID 19 infection. Patient discharged in stable condition with understanding of reasons to return.    Final Clinical Impression(s) / ED Diagnoses Final diagnoses:  COVID-19  Shortness of breath  Chest tightness    Rx / DC Orders ED Discharge Orders    None       Alvira MondaySchlossman, Jahmar Mckelvy, MD 10/24/19 [phone]

## 2019-11-06 DIAGNOSIS — F430 Acute stress reaction: Secondary | ICD-10-CM | POA: Diagnosis not present

## 2019-11-24 DIAGNOSIS — J45901 Unspecified asthma with (acute) exacerbation: Secondary | ICD-10-CM | POA: Diagnosis not present

## 2019-11-24 DIAGNOSIS — J4520 Mild intermittent asthma, uncomplicated: Secondary | ICD-10-CM | POA: Diagnosis not present

## 2019-11-27 DIAGNOSIS — F430 Acute stress reaction: Secondary | ICD-10-CM | POA: Diagnosis not present

## 2019-11-29 DIAGNOSIS — J4521 Mild intermittent asthma with (acute) exacerbation: Secondary | ICD-10-CM | POA: Diagnosis not present

## 2019-11-29 DIAGNOSIS — G4700 Insomnia, unspecified: Secondary | ICD-10-CM | POA: Diagnosis not present

## 2019-12-11 DIAGNOSIS — Z0000 Encounter for general adult medical examination without abnormal findings: Secondary | ICD-10-CM | POA: Diagnosis not present

## 2019-12-11 DIAGNOSIS — J4520 Mild intermittent asthma, uncomplicated: Secondary | ICD-10-CM | POA: Diagnosis not present

## 2019-12-11 DIAGNOSIS — E063 Autoimmune thyroiditis: Secondary | ICD-10-CM | POA: Diagnosis not present

## 2019-12-11 DIAGNOSIS — F430 Acute stress reaction: Secondary | ICD-10-CM | POA: Diagnosis not present

## 2019-12-11 DIAGNOSIS — Z13220 Encounter for screening for lipoid disorders: Secondary | ICD-10-CM | POA: Diagnosis not present

## 2019-12-25 DIAGNOSIS — J45901 Unspecified asthma with (acute) exacerbation: Secondary | ICD-10-CM | POA: Diagnosis not present

## 2019-12-25 DIAGNOSIS — J4520 Mild intermittent asthma, uncomplicated: Secondary | ICD-10-CM | POA: Diagnosis not present

## 2020-01-01 DIAGNOSIS — F430 Acute stress reaction: Secondary | ICD-10-CM | POA: Diagnosis not present

## 2020-01-22 DIAGNOSIS — J45901 Unspecified asthma with (acute) exacerbation: Secondary | ICD-10-CM | POA: Diagnosis not present

## 2020-01-22 DIAGNOSIS — F430 Acute stress reaction: Secondary | ICD-10-CM | POA: Diagnosis not present

## 2020-01-22 DIAGNOSIS — J4520 Mild intermittent asthma, uncomplicated: Secondary | ICD-10-CM | POA: Diagnosis not present

## 2020-02-12 DIAGNOSIS — F430 Acute stress reaction: Secondary | ICD-10-CM | POA: Diagnosis not present

## 2020-02-22 DIAGNOSIS — J4520 Mild intermittent asthma, uncomplicated: Secondary | ICD-10-CM | POA: Diagnosis not present

## 2020-02-22 DIAGNOSIS — J45901 Unspecified asthma with (acute) exacerbation: Secondary | ICD-10-CM | POA: Diagnosis not present

## 2020-03-04 DIAGNOSIS — F419 Anxiety disorder, unspecified: Secondary | ICD-10-CM | POA: Diagnosis not present

## 2020-03-13 DIAGNOSIS — W5501XA Bitten by cat, initial encounter: Secondary | ICD-10-CM | POA: Diagnosis not present

## 2020-03-13 DIAGNOSIS — S61211A Laceration without foreign body of left index finger without damage to nail, initial encounter: Secondary | ICD-10-CM | POA: Diagnosis not present

## 2020-03-18 ENCOUNTER — Ambulatory Visit: Payer: BC Managed Care – PPO | Admitting: Critical Care Medicine

## 2020-03-18 ENCOUNTER — Encounter: Payer: Self-pay | Admitting: Critical Care Medicine

## 2020-03-18 ENCOUNTER — Other Ambulatory Visit: Payer: Self-pay

## 2020-03-18 VITALS — BP 118/64 | HR 85 | Temp 97.4°F | Ht 73.0 in | Wt 184.8 lb

## 2020-03-18 DIAGNOSIS — J309 Allergic rhinitis, unspecified: Secondary | ICD-10-CM | POA: Diagnosis not present

## 2020-03-18 DIAGNOSIS — J4530 Mild persistent asthma, uncomplicated: Secondary | ICD-10-CM

## 2020-03-18 MED ORDER — MONTELUKAST SODIUM 10 MG PO TABS: 10.0000 mg | ORAL_TABLET | Freq: Every day | ORAL | 11 refills | Status: AC

## 2020-03-18 NOTE — Progress Notes (Signed)
Synopsis: Referred in May 2021 for asthma by Ephriam Jenkins, PA.  Subjective:   PATIENT ID: Tracy Knapp GENDER: female DOB: 12-14-1990, MRN: [id number]  Chief Complaint  Patient presents with  . Consult    hx of Asthma, SOB, lungs "burn" with Asthma flare up     [name] is a 29 y/o woman who presents for evaluation of asthma.  She has had asthma since she was in high school, but it has been worsened she had Covid in December 2020.  She is never been hospitalized for asthma.  For several weeks after Covid she was requiring her albuterol very frequently, and eventually she was started on Symbicort by her PCP.  She was using this twice daily with improvement in symptoms, now using it as needed as well Xopenex as needed.  She has never been on Singulair.  Her symptoms are mostly dry cough, and chest burning, and shortness of breath.  She does not wheeze much, but in the past has had more symptoms with wheezing.  She has nocturnal symptoms when her asthma is not well controlled.  Recently she feels like her asthma has been well controlled.  Her main triggers are allergens-if she is doing yard work or around Systems analyst.  Weather changes also worsen her symptoms.  She works as a Animal nutritionist, but is noticed that wearing a mask at work and making sure she washes her hands and showers that she does not mind being around cats as much.  She takes Flonase for allergic rhinosinusitis.  She has a sister with severe asthma and allergies.  She was seen in the ED in December to rule out PE given her chest pain.  CTA was negative.  She has a history of factor V Leiden deficiency diagnosed after her father had a DVT and was found to have this gene mutation.  ACT 21/25    Past Medical History:  Diagnosis Date  . Asthma   . Complication of anesthesia    wakes up from surgery dysphoric  . Factor 5 Leiden mutation, heterozygous ([place])   . Hypothyroidism      Family History  Problem Relation Age of Onset  .  Factor V Leiden deficiency Father   . Deep vein thrombosis Father   . Hypothyroidism Mother   . Factor V Leiden deficiency Sister   . Asthma Sister   . Factor V Leiden deficiency Brother      Past Surgical History:  Procedure Laterality Date  . COLONOSCOPY  2014  . DILATATION & CURETTAGE/HYSTEROSCOPY WITH MYOSURE N/A 02/14/2018   Procedure: DILATATION & CURETTAGE/HYSTEROSCOPY WITH MYOSURE;  Surgeon: Janyth Pupa, DO;  Location: [facility];  Service: Gynecology;  Laterality: N/A;  . INGUINAL HERNIA REPAIR  2013  . WISDOM TOOTH EXTRACTION  2008    Social History   Socioeconomic History  . Marital status: Married    Spouse name: Not on file  . Number of children: Not on file  . Years of education: Not on file  . Highest education level: Not on file  Occupational History  . Not on file  Tobacco Use  . Smoking status: Never Smoker  . Smokeless tobacco: Never Used  Substance and Sexual Activity  . Alcohol use: Yes    Comment: occas  . Drug use: Never  . Sexual activity: Not on file  Other Topics Concern  . Not on file  Social History Narrative  . Not on file   Social Determinants of Health  Financial Resource Strain:   . Difficulty of Paying Living Expenses:   Food Insecurity:   . Worried About Programme researcher, broadcasting/film/video in the Last Year:   . Barista in the Last Year:   Transportation Needs:   . Freight forwarder (Medical):   Marland Kitchen Lack of Transportation (Non-Medical):   Physical Activity:   . Days of Exercise per Week:   . Minutes of Exercise per Session:   Stress:   . Feeling of Stress :   Social Connections:   . Frequency of Communication with Friends and Family:   . Frequency of Social Gatherings with Friends and Family:   . Attends Religious Services:   . Active Member of Clubs or Organizations:   . Attends Banker Meetings:   Marland Kitchen Marital Status:   Intimate Partner Violence:   . Fear of Current or Ex-Partner:   . Emotionally  Abused:   Marland Kitchen Physically Abused:   . Sexually Abused:      Allergies  Allergen Reactions  . Vicodin [Hydrocodone-Acetaminophen]      Immunization History  Administered Date(s) Administered  . Influenza, Quadrivalent, Recombinant, Inj, Pf 07/03/2019  . PFIZER SARS-COV-2 Vaccination 01/13/2020, 02/10/2020    Outpatient Medications Prior to Visit  Medication Sig Dispense Refill  . albuterol (PROVENTIL) (2.5 MG/3ML) 0.083% nebulizer solution Take 2.5 mg by nebulization every 6 (six) hours as needed for wheezing or shortness of breath.    . fexofenadine (ALLEGRA) 180 MG tablet Take 180 mg by mouth daily.    . fluticasone (FLONASE) 50 MCG/ACT nasal spray Place into both nostrils daily.    . hydrOXYzine (ATARAX/VISTARIL) 25 MG tablet Take 25 mg by mouth 2 (two) times daily.    Marland Kitchen levothyroxine (SYNTHROID, LEVOTHROID) 50 MCG tablet Take 50 mcg by mouth daily before breakfast.    . SYMBICORT 80-4.5 MCG/ACT inhaler     . amoxicillin-clavulanate (AUGMENTIN) 875-125 MG tablet Take 1 tablet by mouth 2 (two) times daily.     No facility-administered medications prior to visit.    Review of Systems  Constitutional: Negative for chills and fever.  HENT: Negative.   Respiratory: Positive for cough. Negative for shortness of breath and wheezing.   Cardiovascular: Positive for chest pain. Negative for leg swelling.  Gastrointestinal: Negative for heartburn, nausea and vomiting.  Endo/Heme/Allergies: Positive for environmental allergies.     Objective:   Vitals:   03/18/20 0910  BP: 118/64  Pulse: 85  Temp: (!) 97.4 F (36.3 C)  TempSrc: Temporal  SpO2: 99%  Weight: 184 lb 12.8 oz (83.8 kg)  Height: 6\' 1"  (1.854 m)   99% on   RA BMI Readings from Last 3 Encounters:  03/18/20 24.38 kg/m  10/22/19 22.43 kg/m  02/14/18 22.03 kg/m   Wt Readings from Last 3 Encounters:  03/18/20 184 lb 12.8 oz (83.8 kg)  10/22/19 170 lb (77.1 kg)  02/14/18 167 lb (75.8 kg)    Physical  Exam Vitals reviewed.  Constitutional:      Appearance: Normal appearance. She is not ill-appearing.  HENT:     Head: Normocephalic and atraumatic.  Eyes:     General: No scleral icterus. Cardiovascular:     Rate and Rhythm: Normal rate and regular rhythm.     Heart sounds: No murmur.  Pulmonary:     Comments: CTAB, breathing comfortably on room air, no coughing during the visit.  No conversational dyspnea. Abdominal:     General: There is no distension.  Musculoskeletal:  General: No swelling or deformity.     Cervical back: Neck supple.  Lymphadenopathy:     Cervical: No cervical adenopathy.  Skin:    General: Skin is warm and dry.     Findings: No rash.  Neurological:     General: No focal deficit present.     Mental Status: She is alert.     Coordination: Coordination normal.  Psychiatric:        Mood and Affect: Mood normal.        Behavior: Behavior normal.      CBC    Component Value Date/Time   WBC 4.6 10/22/2019 2044   RBC 4.17 10/22/2019 2044   HGB 13.3 10/22/2019 2044   HCT 39.4 10/22/2019 2044   PLT 156 10/22/2019 2044   MCV 94.5 10/22/2019 2044   MCH 31.9 10/22/2019 2044   MCHC 33.8 10/22/2019 2044   RDW 11.9 10/22/2019 2044   LYMPHSABS 1.1 10/22/2019 2044   MONOABS 0.5 10/22/2019 2044   EOSABS 0.0 10/22/2019 2044   BASOSABS 0.0 10/22/2019 2044    CHEMISTRY No results for input(s): NA, K, CL, CO2, GLUCOSE, BUN, CREATININE, CALCIUM, MG, PHOS in the last 168 hours. CrCl cannot be calculated (Patient's most recent lab result is older than the maximum 21 days allowed.).   Chest Imaging- films reviewed: CTA chest 10/22/2019-mild peripheral lower lobe predominant groundglass opacities.  Patulous esophagus.  No PE.  No significant mediastinal or hilar adenopathy.  Pulmonary Functions Testing Results: No flowsheet data found.      Assessment & Plan:     ICD-10-CM   1. Mild persistent asthma, unspecified whether complicated  J45.30  Pulmonary function test  2. Allergic rhinitis, unspecified seasonality, unspecified trigger  J30.9     Mild persistent asthma, exacerbated by COVID-19. -Symbicort twice daily when symptoms are uncontrolled.  Given her symptoms seem to be better controlled right now, she is okay to continue using it as needed.  Discussed the risk of long-term poor control and the importance of taking medications on a scheduled basis for several weeks and months during phases when it is less controlled. -Continue Xopenex every 4 hours as needed for breakthrough -Start Singulair once daily.  Okay to use this as needed when her symptoms are well controlled. -PFTs to establish level of asthma control and ensure that it correlates well with her symptom burden.  If her PFTs demonstrate uncontrolled asthma, this is an indication that she does not have as much sensitivity to her symptoms and likely requires scheduled meds rather than PRN. -Up-to-date on Covid, flu vaccines -Agree with masking for allergen avoidance.  Allergic rhinosinusitis -Start Singulair once daily; okay to use as needed if symptoms are optimally controlled. -Continue Flonase   RTC in 1 month after PFTs.   Current Outpatient Medications:  .  albuterol (PROVENTIL) (2.5 MG/3ML) 0.083% nebulizer solution, Take 2.5 mg by nebulization every 6 (six) hours as needed for wheezing or shortness of breath., Disp: , Rfl:  .  fexofenadine (ALLEGRA) 180 MG tablet, Take 180 mg by mouth daily., Disp: , Rfl:  .  fluticasone (FLONASE) 50 MCG/ACT nasal spray, Place into both nostrils daily., Disp: , Rfl:  .  hydrOXYzine (ATARAX/VISTARIL) 25 MG tablet, Take 25 mg by mouth 2 (two) times daily., Disp: , Rfl:  .  levothyroxine (SYNTHROID, LEVOTHROID) 50 MCG tablet, Take 50 mcg by mouth daily before breakfast., Disp: , Rfl:  .  SYMBICORT 80-4.5 MCG/ACT inhaler, , Disp: , Rfl:  .  amoxicillin-clavulanate (AUGMENTIN)  875-125 MG tablet, Take 1 tablet by mouth 2 (two)  times daily., Disp: , Rfl:  .  montelukast (SINGULAIR) 10 MG tablet, Take 1 tablet (10 mg total) by mouth at bedtime., Disp: 30 tablet, Rfl: 11    Steffanie Dunn, DO [facility] 03/18/2020 10:47 AM

## 2020-03-18 NOTE — Patient Instructions (Addendum)
Thank you for visiting Dr. Chestine Spore at [facility]. We recommend the following: Orders Placed This Encounter  Procedures  . Pulmonary function test   Orders Placed This Encounter  Procedures  . Pulmonary function test    Standing Status:   Future    Standing Expiration Date:   03/18/2021    Order Specific Question:   Where should this test be performed?    Answer:   [facility]    Order Specific Question:   Full PFT: includes the following: basic spirometry, spirometry pre & post bronchodilator, diffusion capacity (DLCO), lung volumes    Answer:   Full PFT     Keep using Symbicort as needed.  Meds ordered this encounter  Medications  . montelukast (SINGULAIR) 10 MG tablet    Sig: Take 1 tablet (10 mg total) by mouth at bedtime.    Dispense:  30 tablet    Refill:  11    Return in about 4 weeks (around 04/15/2020). after PFTs.    Please do your part to reduce the spread of COVID-19.

## 2020-03-23 DIAGNOSIS — J45901 Unspecified asthma with (acute) exacerbation: Secondary | ICD-10-CM | POA: Diagnosis not present

## 2020-03-23 DIAGNOSIS — J4520 Mild intermittent asthma, uncomplicated: Secondary | ICD-10-CM | POA: Diagnosis not present

## 2020-04-01 DIAGNOSIS — F419 Anxiety disorder, unspecified: Secondary | ICD-10-CM | POA: Diagnosis not present

## 2020-04-07 DIAGNOSIS — R000 Tachycardia, unspecified: Secondary | ICD-10-CM | POA: Diagnosis not present

## 2020-04-07 DIAGNOSIS — R079 Chest pain, unspecified: Secondary | ICD-10-CM | POA: Diagnosis not present

## 2020-04-08 DIAGNOSIS — N915 Oligomenorrhea, unspecified: Secondary | ICD-10-CM | POA: Diagnosis not present

## 2020-04-09 DIAGNOSIS — R000 Tachycardia, unspecified: Secondary | ICD-10-CM | POA: Diagnosis not present

## 2020-04-14 NOTE — Progress Notes (Signed)
Cardiology Office Note   Date:  04/15/2020   ID:  Tracy Knapp, DOB 07-28-1991, MRN [id number]  PCP:  Adrienne Mocha, PA  Cardiologist:   No primary care provider on file. Referring:  Adrienne Mocha, PA  No chief complaint on file.     History of Present Illness: Tracy Knapp is a 29 y.o. female who is referred by Adrienne Mocha, PA for evaluation of palpitations.  I was able to review records from her primary provider.  She described palpitations at the end of May.  She also describes some chest discomfort.  Labs were drawn which include normal electrolytes.  Magnesium was normal.  D-dimer was unremarkable.  High-sensitivity troponin was normal.  TSH was NL by the patient's report.  She had COVID earlier this year.  She was not hospitalized but she says she was fairly sick.  She thought she recovered from this.  She does not think current symptoms are related to this.  For a couple of weeks she has felt her heart pounding.  She feels her pulsating in her fingers and in her eyes.  BX of polyp will be due and she is unable to lie on her left side now.  Her normal resting heart rate was 60s to 70s but it is been closer to the 80s to 90s.  The highest is been about 110.  She did get a [place] and she clearly has some sinus arrhythmia.  There may be some premature atrial beats but it somewhat difficult to assess the P wave morphology.  She is not describing sudden onset rates in the 140s or 150s.  She is not had any presyncope or syncope.  She does yard work.  She is going to get tachypneic exercise.  She has some lawnmowing.  She denies any chest pressure, neck or arm discomfort.  There is no new PND or orthopnea.  She has some mild orthostatic symptoms but this has been chronic.  She is otherwise felt okay.  Of note she did have cardiac work-up of years because she is tall and they were screening for Marfan's but there is no evidence of this and she describes echocardiograms and  ophthalmologic exams.   Past Medical History:  Diagnosis Date  . Asthma   . Complication of anesthesia    wakes up from surgery dysphoric  . Factor 5 Leiden mutation, heterozygous (HCC)   . Hypothyroidism     Past Surgical History:  Procedure Laterality Date  . COLONOSCOPY  2014  . DILATATION & CURETTAGE/HYSTEROSCOPY WITH MYOSURE N/A 02/14/2018   Procedure: DILATATION & CURETTAGE/HYSTEROSCOPY WITH MYOSURE;  Surgeon: Myna Hidalgo, DO;  Location:  [facility];  Service: Gynecology;  Laterality: N/A;  . INGUINAL HERNIA REPAIR  2013  . WISDOM TOOTH EXTRACTION  2008     Current Outpatient Medications  Medication Sig Dispense Refill  . albuterol (PROVENTIL) (2.5 MG/3ML) 0.083% nebulizer solution Take 2.5 mg by nebulization every 6 (six) hours as needed for wheezing or shortness of breath.    . fexofenadine (ALLEGRA) 180 MG tablet Take 180 mg by mouth daily.    . fluticasone (FLONASE) 50 MCG/ACT nasal spray Place into both nostrils daily.    . hydrOXYzine (ATARAX/VISTARIL) 25 MG tablet Take 25 mg by mouth 2 (two) times daily.    Marland Kitchen levothyroxine (SYNTHROID, LEVOTHROID) 50 MCG tablet Take 50 mcg by mouth daily before breakfast.    . montelukast (SINGULAIR) 10 MG tablet Take 1 tablet (10 mg total)  by mouth at bedtime. 30 tablet 11  . SYMBICORT 80-4.5 MCG/ACT inhaler     . propranolol (INDERAL) 10 MG tablet Take 1 tablet (10 mg total) by mouth every 8 (eight) hours as needed (PALPITATIONS). 90 tablet 3   No current facility-administered medications for this visit.    Allergies:   Vicodin [hydrocodone-acetaminophen]    Social History:  The patient  reports that she has never smoked. She has never used smokeless tobacco. She reports current alcohol use. She reports that she does not use drugs.   Family History:  The patient's family history includes Asthma in her sister; Deep vein thrombosis in her father; Factor V Leiden deficiency in her brother, father, and sister;  Hypothyroidism in her mother.    ROS:  Please see the history of present illness.   Otherwise, review of systems are positive for none.   All other systems are reviewed and negative.    PHYSICAL EXAM: VS:  BP 110/70   Pulse 77   Ht 6\' 1"  (1.854 m)   Wt 184 lb (83.5 kg)   SpO2 96%   BMI 24.28 kg/m  , BMI Body mass index is 24.28 kg/m. GENERAL:  Well appearing HEENT:  Pupils equal round and reactive, fundi not visualized, oral mucosa unremarkable NECK:  No jugular venous distention, waveform within normal limits, carotid upstroke brisk and symmetric, no bruits, no thyromegaly LYMPHATICS:  No cervical, inguinal adenopathy LUNGS:  Clear to auscultation bilaterally BACK:  No CVA tenderness CHEST:  Unremarkable HEART:  PMI not displaced or sustained,S1 and S2 within normal limits, no S3, no S4, no clicks, no rubs, no murmurs ABD:  Flat, positive bowel sounds normal in frequency in pitch, no bruits, no rebound, no guarding, no midline pulsatile mass, no hepatomegaly, no splenomegaly EXT:  2 plus pulses throughout, no edema, no cyanosis no clubbing SKIN:  No rashes no nodules NEURO:  Cranial nerves II through XII grossly intact, motor grossly intact throughout PSYCH:  Cognitively intact, oriented to person place and time    EKG:  EKG is ordered today. The ekg ordered today demonstrates sinus rhythm, sinus arrhythmia, rate 77, axis within normal limits, intervals within normal limits, no acute ST-T wave changes.   Recent Labs: 10/22/2019: ALT 15; B Natriuretic Peptide 19.1; BUN 11; Creatinine, Ser 0.94; Hemoglobin 13.3; Platelets 156; Potassium 4.2; Sodium 140    Lipid Panel No results found for: CHOL, TRIG, HDL, CHOLHDL, VLDL, LDLCALC, LDLDIRECT    Wt Readings from Last 3 Encounters:  04/15/20 184 lb (83.5 kg)  03/18/20 184 lb 12.8 oz (83.8 kg)  10/22/19 170 lb (77.1 kg)      Other studies Reviewed: Additional studies/ records that were reviewed today include: Office  records, labs. Review of the above records demonstrates:  Please see elsewhere in the note.     ASSESSMENT AND PLAN:  PALPITATION:   She did have it significant increase in her heart rate just with movement on the table.  She has had the appropriate lab work.  Orthostatics were negative although her blood pressure went up slightly..  I am going to apply a 3-day monitor.  She will get an echocardiogram.  I am going to give her propranolol 10 mg as needed.  Further evaluation will be based on these results.  COVID EDUCATION: She has had Covid.  Current medicines are reviewed at length with the patient today.  The patient does not have concerns regarding medicines.  The following changes have been made:  As above.  Labs/ tests ordered today include:   Orders Placed This Encounter  Procedures  . LONG TERM MONITOR (3-14 DAYS)  . EKG 12-Lead  . ECHOCARDIOGRAM COMPLETE     Disposition:   FU with me after the monitor and echo.     Signed, Rollene Rotunda, MD  04/15/2020 11:00 AM    [facility]

## 2020-04-15 ENCOUNTER — Encounter: Payer: Self-pay | Admitting: Cardiology

## 2020-04-15 ENCOUNTER — Other Ambulatory Visit: Payer: Self-pay

## 2020-04-15 ENCOUNTER — Telehealth: Payer: Self-pay | Admitting: Radiology

## 2020-04-15 ENCOUNTER — Ambulatory Visit: Payer: BC Managed Care – PPO | Admitting: Cardiology

## 2020-04-15 VITALS — BP 110/70 | HR 77 | Ht 73.0 in | Wt 184.0 lb

## 2020-04-15 DIAGNOSIS — R002 Palpitations: Secondary | ICD-10-CM | POA: Diagnosis not present

## 2020-04-15 MED ORDER — PROPRANOLOL HCL 10 MG PO TABS: 10.0000 mg | ORAL_TABLET | Freq: Three times a day (TID) | ORAL | 3 refills | Status: DC | PRN

## 2020-04-15 NOTE — Telephone Encounter (Signed)
Enrolled patient for a 3 day Zio monitor to be mailed to patients home.  

## 2020-04-15 NOTE — Patient Instructions (Signed)
Medication Instructions:  START PROPRANOLOL 10MG  EVERY 8 HOURS AS NEEDED *If you need a refill on your cardiac medications before your next appointment, please call your pharmacy*  Lab Work: NONE ORDERED THIS VISIT  Testing/Procedures: Your physician has requested that you have an echocardiogram. Echocardiography is a painless test that uses sound waves to create images of your heart. It provides your doctor with information about the size and shape of your heart and how well your heart's chambers and valves are working. This procedure takes approximately one hour. There are no restrictions for this procedure. [address]  Your physician has recommended that you wear an event monitor. Event monitors are medical devices that record the heart's electrical activity. Doctors most often these monitors to diagnose arrhythmias. Arrhythmias are problems with the speed or rhythm of the heartbeat. The monitor is a small, portable device. You can wear one while you do your normal daily activities. This is usually used to diagnose what is causing palpitations/syncope (passing out). CALL [phone] TO SCHEDULE APPOINTMENT TO PLACE MONITOR IF NEEDED  Follow-Up: At [facility], you and your health needs are our priority.  As part of our continuing mission to provide you with exceptional heart care, we have created designated Provider Care Teams.  These Care Teams include your primary Cardiologist (physician) and Advanced Practice Providers (APPs -  Physician Assistants and Nurse Practitioners) who all work together to provide you with the care you need, when you need it.  Your next appointment:   6 week(s)  The format for your next appointment:   In Person  Provider:   [facility], MD  Other Instructions Your physician has recommended that you wear a 3 DAY ZIO-PATCH monitor. The Zio patch cardiac monitor continuously records heart rhythm data for up to 14 days, this is  for patients being evaluated for multiple types heart rhythms. For the first 24 hours post application, please avoid getting the Zio monitor wet in the shower or by excessive sweating during exercise. After that, feel free to carry on with regular activities. Keep soaps and lotions away from the ZIO XT Patch.  This will be mailed to you, please expect 7-10 days to receive.  CAN BE Placed at our [facility] location - [address].

## 2020-04-20 ENCOUNTER — Ambulatory Visit (INDEPENDENT_AMBULATORY_CARE_PROVIDER_SITE_OTHER): Payer: BC Managed Care – PPO

## 2020-04-20 ENCOUNTER — Other Ambulatory Visit: Payer: Self-pay

## 2020-04-20 ENCOUNTER — Ambulatory Visit (HOSPITAL_COMMUNITY): Payer: BC Managed Care – PPO | Attending: Cardiology

## 2020-04-20 DIAGNOSIS — R002 Palpitations: Secondary | ICD-10-CM | POA: Diagnosis not present

## 2020-04-23 DIAGNOSIS — J4520 Mild intermittent asthma, uncomplicated: Secondary | ICD-10-CM | POA: Diagnosis not present

## 2020-04-23 DIAGNOSIS — J45901 Unspecified asthma with (acute) exacerbation: Secondary | ICD-10-CM | POA: Diagnosis not present

## 2020-04-29 DIAGNOSIS — F419 Anxiety disorder, unspecified: Secondary | ICD-10-CM | POA: Diagnosis not present

## 2020-04-30 ENCOUNTER — Telehealth: Payer: Self-pay | Admitting: Cardiology

## 2020-04-30 NOTE — Telephone Encounter (Signed)
Echo:  Rollene Rotunda, MD  04/21/2020 8:09 AM EDT     Mild aortic sclerosis. Otherwise no significant findings. NL EF.     Monitor: report is not in epic yet  Advised patient of this and she will wait on call regarding monitor results

## 2020-04-30 NOTE — Telephone Encounter (Signed)
New Message  Pt is calling for results from Echo and Monitor   Please call

## 2020-05-02 ENCOUNTER — Other Ambulatory Visit (HOSPITAL_COMMUNITY)
Admission: RE | Admit: 2020-05-02 | Discharge: 2020-05-02 | Disposition: A | Discharge status: Home / Self Care | Payer: BC Managed Care – PPO | Source: Ambulatory Visit | Attending: Critical Care Medicine | Admitting: Critical Care Medicine

## 2020-05-02 DIAGNOSIS — Z20822 Contact with and (suspected) exposure to covid-19: Secondary | ICD-10-CM | POA: Diagnosis not present

## 2020-05-02 DIAGNOSIS — Z01812 Encounter for preprocedural laboratory examination: Secondary | ICD-10-CM | POA: Insufficient documentation

## 2020-05-02 LAB — SARS CORONAVIRUS 2 (TAT 6-24 HRS): SARS Coronavirus 2: NEGATIVE

## 2020-05-05 ENCOUNTER — Telehealth: Payer: Self-pay | Admitting: Cardiology

## 2020-05-05 MED ORDER — METOPROLOL TARTRATE 25 MG PO TABS: ORAL_TABLET | ORAL | 6 refills | Status: AC

## 2020-05-05 NOTE — Telephone Encounter (Signed)
Called patient left Raquel's advice on personal voice mail.Metoprolol prescription sent to pharmacy.

## 2020-05-05 NOTE — Telephone Encounter (Signed)
Returned call to patient she stated she forgot to tell Dr.Hochrein she has asthma.She wanted him to ask him if she needs a different beta blocker prescribed instead of propranolol.Advised Dr.Hochrein is out of office.I will send message to pharmacist for advice.

## 2020-05-05 NOTE — Telephone Encounter (Signed)
New message   Patient has questions about the propranolol (INDERAL) 10 MG tablet in reference to asthma. Please call.

## 2020-05-05 NOTE — Telephone Encounter (Signed)
Will recommend against using propranolol. She can use a more cardioselective beta-blocker like metoprolol tartrate 12.5mg  BID as needed for palpitations

## 2020-05-06 ENCOUNTER — Ambulatory Visit: Payer: BC Managed Care – PPO | Admitting: Acute Care

## 2020-05-06 ENCOUNTER — Ambulatory Visit: Payer: BC Managed Care – PPO | Admitting: Critical Care Medicine

## 2020-05-06 ENCOUNTER — Ambulatory Visit (INDEPENDENT_AMBULATORY_CARE_PROVIDER_SITE_OTHER): Payer: BC Managed Care – PPO | Admitting: Critical Care Medicine

## 2020-05-06 ENCOUNTER — Other Ambulatory Visit: Payer: Self-pay

## 2020-05-06 ENCOUNTER — Encounter: Payer: Self-pay | Admitting: Acute Care

## 2020-05-06 VITALS — BP 122/72 | HR 71 | Temp 97.3°F | Ht 75.0 in | Wt 184.0 lb

## 2020-05-06 DIAGNOSIS — J309 Allergic rhinitis, unspecified: Secondary | ICD-10-CM | POA: Diagnosis not present

## 2020-05-06 DIAGNOSIS — J4530 Mild persistent asthma, uncomplicated: Secondary | ICD-10-CM

## 2020-05-06 LAB — PULMONARY FUNCTION TEST
DL/VA % pred: 131 %
DL/VA: 5.68 ml/min/mmHg/L
DLCO cor % pred: 109 %
DLCO cor: 33.46 ml/min/mmHg
DLCO unc % pred: 109 %
DLCO unc: 33.46 ml/min/mmHg
FEF 25-75 Post: 3.81 L/sec
FEF 25-75 Pre: 3.21 L/sec
FEF2575-%Change-Post: 18 %
FEF2575-%Pred-Post: 92 %
FEF2575-%Pred-Pre: 78 %
FEV1-%Change-Post: 4 %
FEV1-%Pred-Post: 94 %
FEV1-%Pred-Pre: 91 %
FEV1-Post: 3.99 L
FEV1-Pre: 3.83 L
FEV1FVC-%Change-Post: 3 %
FEV1FVC-%Pred-Pre: 93 %
FEV6-%Change-Post: 0 %
FEV6-%Pred-Post: 97 %
FEV6-%Pred-Pre: 97 %
FEV6-Post: 4.87 L
FEV6-Pre: 4.86 L
FEV6FVC-%Change-Post: 0 %
FEV6FVC-%Pred-Post: 101 %
FEV6FVC-%Pred-Pre: 101 %
FVC-%Change-Post: 0 %
FVC-%Pred-Post: 96 %
FVC-%Pred-Pre: 95 %
FVC-Post: 4.88 L
FVC-Pre: 4.86 L
Post FEV1/FVC ratio: 82 %
Post FEV6/FVC ratio: 100 %
Pre FEV1/FVC ratio: 79 %
Pre FEV6/FVC Ratio: 100 %
RV % pred: 93 %
RV: 1.73 L
TLC % pred: 107 %
TLC: 7.02 L

## 2020-05-06 NOTE — Patient Instructions (Addendum)
It is nice to meet you today. -Continue Symbicort twice daily when symptoms are uncontrolled. You may use as needed when your symptoms are well controlled.   -Continue Xopenex every 4 hours as needed for breakthrough -  PFTs demonstrate control of your  asthma, at present time, on maintenance medications - Continue  masking for allergen avoidance.  Allergic rhinosinusitis -Continue  Singulair once daily; okay to use as needed if symptoms are optimally controlled. -Continue Flonase daily for allergic rhinitis  Follow up in 3 months with Dr. Chestine Spore or before as needed. Be very aware of allergy flare symptoms and initiate therapy immediately.

## 2020-05-06 NOTE — Progress Notes (Signed)
Full PFT performed today. °

## 2020-05-06 NOTE — Progress Notes (Signed)
History of Present Illness Tracy Knapp is a 29 y.o. female with asthma that has worsened  since she had Covid 10/2019. She was seen by Dr. Chestine Spore 03/18/2020 for consult for asthma flare.   Synopsis Covid 10/2019 with worsening baseline asthma .  She did require increase in her albuterol for several weeks after she was diagnosed with Covid. She was started on Symbicort by her PCP. She did have improvement in symptoms with twice daily use. Her symptoms are mostly dry cough, and chest burning, and shortness of breath. She does not wheeze much, but in the past has had more symptoms with wheezing. She has nocturnal symptoms when her asthma is not well controlled. Recently she feels like her asthma has been well controlled.  Her main triggers are allergens-if she is doing yard work or around Glass blower/designer.  Weather changes also worsen her symptoms.  She works as a International aid/development worker, but is noticed that wearing a mask at work and making sure she washes her hands and showers that she does not mind being around cats as much.  She takes Flonase for allergic rhinosinusitis.  She has a sister with severe asthma and allergies.  She was seen in the ED in December to rule out PE given her chest pain.  CTA was negative.  She has a history of factor V Leiden deficiency diagnosed after her father had a DVT and was found to have this gene mutation.   She was seen by Dr. Chestine Spore 03/2020. Recommendations after that visit were  To continue Symbicort BID, Xopenex Q 4 hours prn, start Singulair once daily, and PFT's to establish level of asthma control. Dr. Chestine Spore is concerned she does not have as much sensitivity to her symptoms as she thinks.  She discussed the risk of  Long term poor control, and taking medications on a scheduled basis.   05/06/2020  Pt. Presents for follow up. She has been doing well on the Symbicort BID, and Singulair daily. Rare need for rescue inhaler. . She states she would prefer to only use her Symbicort as  needed when she has symptoms. She states she has not had wheezing. She feels her asthma flares  only occur at certain times of the year, and with  exposure to her triggers. 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   Test Results:        CBC Latest Ref Rng & Units 10/22/2019 02/13/2018  WBC 4.0 - 10.5 K/uL 4.6 6.9  Hemoglobin 12.0 - 15.0 g/dL 97.9 89.2  Hematocrit 36 - 46 % 39.4 42.0  Platelets 150 - 400 K/uL 156 240    BMP Latest Ref Rng & Units 10/22/2019 02/13/2018  Glucose 70 - 99 mg/dL 95 89  BUN 6 - 20 mg/dL 11 12  Creatinine 1.19 - 1.00 mg/dL 4.17 4.08  Sodium 144 - 145 mmol/L 140 136  Potassium 3.5 - 5.1 mmol/L 4.2 4.6  Chloride 98 - 111 mmol/L 105 103  CO2 22 - 32 mmol/L 25 23  Calcium 8.9 - 10.3 mg/dL 9.2 9.5    BNP    Component Value Date/Time   BNP 19.1 10/22/2019 2044    ProBNP No results found for: PROBNP  PFT    Component Value Date/Time   FEV1PRE 3.83 05/06/2020 1458   FEV1POST 3.99 05/06/2020 1458   FVCPRE 4.86 05/06/2020 1458   FVCPOST  4.88 05/06/2020 1458   TLC 7.02 05/06/2020 1458   DLCOUNC 33.46 05/06/2020 1458   PREFEV1FVCRT 79 05/06/2020 1458   PSTFEV1FVCRT 82 05/06/2020 1458    ECHOCARDIOGRAM COMPLETE  Result Date: 04/20/2020    ECHOCARDIOGRAM REPORT   Patient Name:   Tracy Knapp Date of Exam: 04/20/2020 Medical Rec #:  [id number]       Height:       73.0 in Accession #:    [id number]      Weight:       184.0 lb Date of Birth:  02-01-1991       BSA:          2.077 m Patient Age:    28 years        BP:           110/70 mmHg Patient Gender: F               HR:           81 bpm. Exam Location:  [address] Procedure: 2D Echo, 3D Echo, Cardiac Doppler, Color Doppler and Strain Analysis Indications:    R00.2 Palpitations  History:        Patient has no prior history of Echocardiogram examinations.                 Hypothyroidism.  Covid-19(December 2020).  Sonographer:    Sedonia Small Rodgers-Jones RDCS Referring Phys: [id number] JAMES HOCHREIN IMPRESSIONS  1. Left ventricular ejection fraction, by estimation, is 65 to 70%. The left ventricle has normal function. The left ventricle has no regional wall motion abnormalities. Left ventricular diastolic parameters were normal. The average left ventricular global longitudinal strain is -22.1 %. The global longitudinal strain is normal.  2. Right ventricular systolic function is normal. The right ventricular size is normal. Tricuspid regurgitation signal is inadequate for assessing PA pressure.  3. The mitral valve is normal in structure. No evidence of mitral valve regurgitation. No evidence of mitral stenosis.  4. The aortic valve is tricuspid. Aortic valve regurgitation is not visualized. Mild to moderate aortic valve sclerosis/calcification is present, without any evidence of aortic stenosis.  5. The inferior vena cava is normal in size with greater than 50% respiratory variability, suggesting right atrial pressure of 3 mmHg. FINDINGS  Left Ventricle: Left ventricular ejection fraction, by estimation, is 65 to 70%. The left ventricle has normal function. The left ventricle has no regional wall motion abnormalities. The average left ventricular global longitudinal strain is -22.1 %. The global longitudinal strain is normal. The left ventricular internal cavity size was normal in size. There is no left ventricular hypertrophy. Left ventricular diastolic parameters were normal. Normal left ventricular filling pressure. Right Ventricle: The right ventricular size is normal. No increase in right ventricular wall thickness. Right ventricular systolic function is normal. Tricuspid regurgitation signal is inadequate for assessing PA pressure. Left Atrium: Left atrial size was normal in size. Right Atrium: Right atrial size was normal in size. Pericardium: There is no evidence of pericardial effusion. Mitral  Valve: The mitral valve is normal in structure. Normal mobility of the mitral valve leaflets. No evidence of mitral valve regurgitation. No evidence of mitral valve stenosis. Tricuspid Valve: The tricuspid valve is normal in structure. Tricuspid valve regurgitation is not demonstrated. No evidence of tricuspid stenosis. Aortic Valve: The aortic valve is tricuspid. Aortic valve regurgitation is not visualized. Mild to moderate aortic valve sclerosis/calcification is present, without any evidence of aortic stenosis. Pulmonic Valve: The pulmonic valve was normal  in structure. Pulmonic valve regurgitation is trivial. No evidence of pulmonic stenosis. Aorta: The aortic root is normal in size and structure. Venous: The inferior vena cava is normal in size with greater than 50% respiratory variability, suggesting right atrial pressure of 3 mmHg. IAS/Shunts: No atrial level shunt detected by color flow Doppler.  LEFT VENTRICLE PLAX 2D LVIDd:         4.90 cm  Diastology LVIDs:         2.80 cm  LV e' lateral:   15.20 cm/s LV PW:         1.00 cm  LV E/e' lateral: 6.3 LV IVS:        0.60 cm  LV e' medial:    9.79 cm/s LVOT diam:     2.00 cm  LV E/e' medial:  9.8 LVOT Area:     3.14 cm                         2D Longitudinal Strain                         2D Strain GLS (A2C):   -20.8 %                         2D Strain GLS (A3C):   -21.3 %                         2D Strain GLS (A4C):   -24.2 %                         2D Strain GLS Avg:     -22.1 %                          3D Volume EF:                         3D EF:        67 %                         LV EDV:       144 ml                         LV ESV:       48 ml                         LV SV:        96 ml RIGHT VENTRICLE RV Basal diam:  2.80 cm RV S prime:     18.00 cm/s TAPSE (M-mode): 3.4 cm LEFT ATRIUM             Index       RIGHT ATRIUM          Index LA diam:        3.40 cm 1.64 cm/m  RA Area:     9.33 cm LA Vol (A2C):   39.7 ml 19.12 ml/m RA Volume:   20.00 ml 9.63  ml/m LA Vol (A4C):   27.6 ml 13.29 ml/m LA Biplane Vol: 33.7 ml 16.23 ml/m   AORTA Ao Root diam: 3.20 cm Ao Asc diam:  2.90 cm MITRAL VALVE MV  Area (PHT): 3.27 cm    SHUNTS MV Decel Time: 232 msec    Systemic Diam: 2.00 cm MV E velocity: 95.70 cm/s MV A velocity: 85.30 cm/s MV E/A ratio:  1.12 Armanda Magic MD Electronically signed by Armanda Magic MD Signature Date/Time: 04/20/2020/10:51:11 AM    Final      Past medical hx Past Medical History:  Diagnosis Date  . Asthma   . Complication of anesthesia    wakes up from surgery dysphoric  . Factor 5 Leiden mutation, heterozygous (HCC)   . Hypothyroidism      Social History   Tobacco Use  . Smoking status: Never Smoker  . Smokeless tobacco: Never Used  Vaping Use  . Vaping Use: Never used  Substance Use Topics  . Alcohol use: Yes    Comment: occas  . Drug use: Never    Ms.Rusk reports that she has never smoked. She has never used smokeless tobacco. She reports current alcohol use. She reports that she does not use drugs.  Tobacco Cessation: Never smoker  Past surgical hx, Family hx, Social hx all reviewed.  Current Outpatient Medications on File Prior to Visit  Medication Sig  . albuterol (PROVENTIL) (2.5 MG/3ML) 0.083% nebulizer solution Take 2.5 mg by nebulization every 6 (six) hours as needed for wheezing or shortness of breath.  . fexofenadine (ALLEGRA) 180 MG tablet Take 180 mg by mouth daily.  . fluticasone (FLONASE) 50 MCG/ACT nasal spray Place into both nostrils daily.  . hydrOXYzine (ATARAX/VISTARIL) 25 MG tablet Take 25 mg by mouth 2 (two) times daily.  Marland Kitchen levothyroxine (SYNTHROID, LEVOTHROID) 50 MCG tablet Take 50 mcg by mouth daily before breakfast.  . metoprolol tartrate (LOPRESSOR) 25 MG tablet Take 1/2 tablet ( 12.5 mg ) twice a day as needed for palpitations  . montelukast (SINGULAIR) 10 MG tablet Take 1 tablet (10 mg total) by mouth at bedtime.  . SYMBICORT 80-4.5 MCG/ACT inhaler    No current  facility-administered medications on file prior to visit.     Allergies  Allergen Reactions  . Vicodin [Hydrocodone-Acetaminophen]     Review Of Systems:  Constitutional:   No  weight loss, night sweats,  Fevers, chills, fatigue, or  lassitude.  HEENT:   No headaches,  Difficulty swallowing,  Tooth/dental problems, or  Sore throat,                No sneezing, itching, ear ache, nasal congestion, post nasal drip,   CV:  No chest pain,  Orthopnea, PND, swelling in lower extremities, anasarca, dizziness, palpitations, syncope.   GI  No heartburn, indigestion, abdominal pain, nausea, vomiting, diarrhea, change in bowel habits, loss of appetite, bloody stools.   Resp: No shortness of breath with exertion or at rest.  No excess mucus, no productive cough,  No non-productive cough,  No coughing up of blood.  No change in color of mucus.  No wheezing.  No chest wall deformity  Skin: no rash or lesions.  GU: no dysuria, change in color of urine, no urgency or frequency.  No flank pain, no hematuria   MS:  No joint pain or swelling.  No decreased range of motion.  No back pain.  Psych:  No change in mood or affect. No depression or anxiety.  No memory loss.   Vital Signs BP 122/72 (BP Location: Left Arm, Cuff Size: Normal)   Pulse 71   Temp (!) 97.3 F (36.3 C) (Oral)   Ht 6\' 3"  (1.905 m)  Wt 184 lb (83.5 kg)   SpO2 98%   BMI 23.00 kg/m    Physical Exam:  General- No distress,  A&Ox3, appropriate  ENT: No sinus tenderness, TM clear, pale nasal mucosa, no oral exudate,no post nasal drip, no LAN Cardiac: S1, S2, regular rate and rhythm, no murmur Chest: No wheeze/ rales/ dullness; no accessory muscle use, no nasal flaring, no sternal retractions Abd.: Soft Non-tender, ND, BS +, Body mass index is 23 kg/m. Ext: No clubbing cyanosis, edema Neuro:  normal strength, MAE x 4, A&O x 3 Skin: No rashes, No lesions, warm and dry Psych: normal mood and  behavior   Assessment/Plan Mild Persistent Asthma Well controlled on maintenance  therapy Plan Continue Symbicort twice daily when symptoms are uncontrolled. You may use as needed when your symptoms are well controlled.   -Continue Xopenex every 4 hours as needed for breakthrough -  PFTs demonstrate control of your  Asthma t present time, on maintenance medications  - Continue  masking for allergen avoidance  Allergic rhinosinusitis -Continue  Singulair once daily; okay to use as needed if symptoms are optimally controlled. -Continue Flonase daily for allergic rhinitis  Follow up in 3 months with Dr. Chestine Spore or before as needed. Be very aware of allergy flare symptoms and initiate therapy immediately. Remember that there are risks of   Long term poor control of asthma, and taking maintenance medications on a scheduled basis can reduce this risk. Bevelyn Ngo, NP 05/06/2020  4:30 PM

## 2020-05-11 DIAGNOSIS — R002 Palpitations: Secondary | ICD-10-CM | POA: Diagnosis not present

## 2020-05-12 ENCOUNTER — Encounter: Payer: Self-pay | Admitting: Acute Care

## 2020-05-23 DIAGNOSIS — J4520 Mild intermittent asthma, uncomplicated: Secondary | ICD-10-CM | POA: Diagnosis not present

## 2020-05-23 DIAGNOSIS — J45901 Unspecified asthma with (acute) exacerbation: Secondary | ICD-10-CM | POA: Diagnosis not present

## 2020-06-03 DIAGNOSIS — F419 Anxiety disorder, unspecified: Secondary | ICD-10-CM | POA: Diagnosis not present

## 2020-06-17 DIAGNOSIS — F419 Anxiety disorder, unspecified: Secondary | ICD-10-CM | POA: Diagnosis not present

## 2020-06-23 DIAGNOSIS — J4520 Mild intermittent asthma, uncomplicated: Secondary | ICD-10-CM | POA: Diagnosis not present

## 2020-06-23 DIAGNOSIS — J45901 Unspecified asthma with (acute) exacerbation: Secondary | ICD-10-CM | POA: Diagnosis not present

## 2020-06-26 ENCOUNTER — Ambulatory Visit: Payer: BC Managed Care – PPO | Admitting: Cardiology

## 2020-07-01 DIAGNOSIS — F4310 Post-traumatic stress disorder, unspecified: Secondary | ICD-10-CM | POA: Diagnosis not present

## 2020-07-08 DIAGNOSIS — F4310 Post-traumatic stress disorder, unspecified: Secondary | ICD-10-CM | POA: Diagnosis not present

## 2020-07-15 DIAGNOSIS — F4310 Post-traumatic stress disorder, unspecified: Secondary | ICD-10-CM | POA: Diagnosis not present

## 2020-07-22 DIAGNOSIS — F4310 Post-traumatic stress disorder, unspecified: Secondary | ICD-10-CM | POA: Diagnosis not present

## 2020-07-29 DIAGNOSIS — F4310 Post-traumatic stress disorder, unspecified: Secondary | ICD-10-CM | POA: Diagnosis not present

## 2020-08-05 DIAGNOSIS — F4310 Post-traumatic stress disorder, unspecified: Secondary | ICD-10-CM | POA: Diagnosis not present

## 2020-08-12 DIAGNOSIS — F4310 Post-traumatic stress disorder, unspecified: Secondary | ICD-10-CM | POA: Diagnosis not present

## 2020-08-19 DIAGNOSIS — F4310 Post-traumatic stress disorder, unspecified: Secondary | ICD-10-CM | POA: Diagnosis not present

## 2020-08-26 DIAGNOSIS — F4310 Post-traumatic stress disorder, unspecified: Secondary | ICD-10-CM | POA: Diagnosis not present

## 2020-09-02 DIAGNOSIS — F4310 Post-traumatic stress disorder, unspecified: Secondary | ICD-10-CM | POA: Diagnosis not present

## 2020-09-09 DIAGNOSIS — F4310 Post-traumatic stress disorder, unspecified: Secondary | ICD-10-CM | POA: Diagnosis not present

## 2020-09-16 DIAGNOSIS — F4310 Post-traumatic stress disorder, unspecified: Secondary | ICD-10-CM | POA: Diagnosis not present

## 2020-09-23 DIAGNOSIS — F4310 Post-traumatic stress disorder, unspecified: Secondary | ICD-10-CM | POA: Diagnosis not present

## 2021-03-29 DIAGNOSIS — D225 Melanocytic nevi of trunk: Secondary | ICD-10-CM | POA: Diagnosis not present

## 2021-05-07 DIAGNOSIS — K921 Melena: Secondary | ICD-10-CM | POA: Diagnosis not present

## 2021-05-07 DIAGNOSIS — D122 Benign neoplasm of ascending colon: Secondary | ICD-10-CM | POA: Diagnosis not present

## 2021-05-07 DIAGNOSIS — Z1589 Genetic susceptibility to other disease: Secondary | ICD-10-CM | POA: Diagnosis not present

## 2021-05-07 DIAGNOSIS — K2950 Unspecified chronic gastritis without bleeding: Secondary | ICD-10-CM | POA: Diagnosis not present

## 2021-05-07 DIAGNOSIS — Z1211 Encounter for screening for malignant neoplasm of colon: Secondary | ICD-10-CM | POA: Diagnosis not present

## 2021-05-07 DIAGNOSIS — K635 Polyp of colon: Secondary | ICD-10-CM | POA: Diagnosis not present

## 2021-05-11 ENCOUNTER — Other Ambulatory Visit: Payer: Self-pay | Admitting: Physician Assistant

## 2021-05-11 DIAGNOSIS — Z1509 Genetic susceptibility to other malignant neoplasm: Secondary | ICD-10-CM

## 2021-05-28 ENCOUNTER — Ambulatory Visit
Admission: RE | Admit: 2021-05-28 | Discharge: 2021-05-28 | Disposition: A | Discharge status: Home / Self Care | Payer: BC Managed Care – PPO | Source: Ambulatory Visit | Attending: Physician Assistant | Admitting: Physician Assistant

## 2021-05-28 DIAGNOSIS — Z1509 Genetic susceptibility to other malignant neoplasm: Secondary | ICD-10-CM

## 2021-05-28 DIAGNOSIS — E079 Disorder of thyroid, unspecified: Secondary | ICD-10-CM | POA: Diagnosis not present

## 2021-05-31 DIAGNOSIS — E282 Polycystic ovarian syndrome: Secondary | ICD-10-CM | POA: Diagnosis not present

## 2021-06-30 DIAGNOSIS — H7291 Unspecified perforation of tympanic membrane, right ear: Secondary | ICD-10-CM | POA: Diagnosis not present

## 2021-07-08 DIAGNOSIS — H65111 Acute and subacute allergic otitis media (mucoid) (sanguinous) (serous), right ear: Secondary | ICD-10-CM | POA: Diagnosis not present

## 2021-07-08 DIAGNOSIS — H6241 Otitis externa in other diseases classified elsewhere, right ear: Secondary | ICD-10-CM | POA: Diagnosis not present

## 2021-07-08 DIAGNOSIS — B369 Superficial mycosis, unspecified: Secondary | ICD-10-CM | POA: Diagnosis not present

## 2021-07-19 DIAGNOSIS — H6241 Otitis externa in other diseases classified elsewhere, right ear: Secondary | ICD-10-CM | POA: Diagnosis not present

## 2021-07-19 DIAGNOSIS — B369 Superficial mycosis, unspecified: Secondary | ICD-10-CM | POA: Diagnosis not present

## 2021-07-29 DIAGNOSIS — H6241 Otitis externa in other diseases classified elsewhere, right ear: Secondary | ICD-10-CM | POA: Diagnosis not present

## 2021-07-29 DIAGNOSIS — B369 Superficial mycosis, unspecified: Secondary | ICD-10-CM | POA: Diagnosis not present

## 2021-07-29 IMAGING — DX DG CHEST 1V PORT
1 series · 1 of 1 positions shown · non-contrast
Comparison: None.

CLINICAL DATA: 2MASP-6W positive patient reports a fever.

EXAM:
PORTABLE CHEST 1 VIEW

[chest ap]
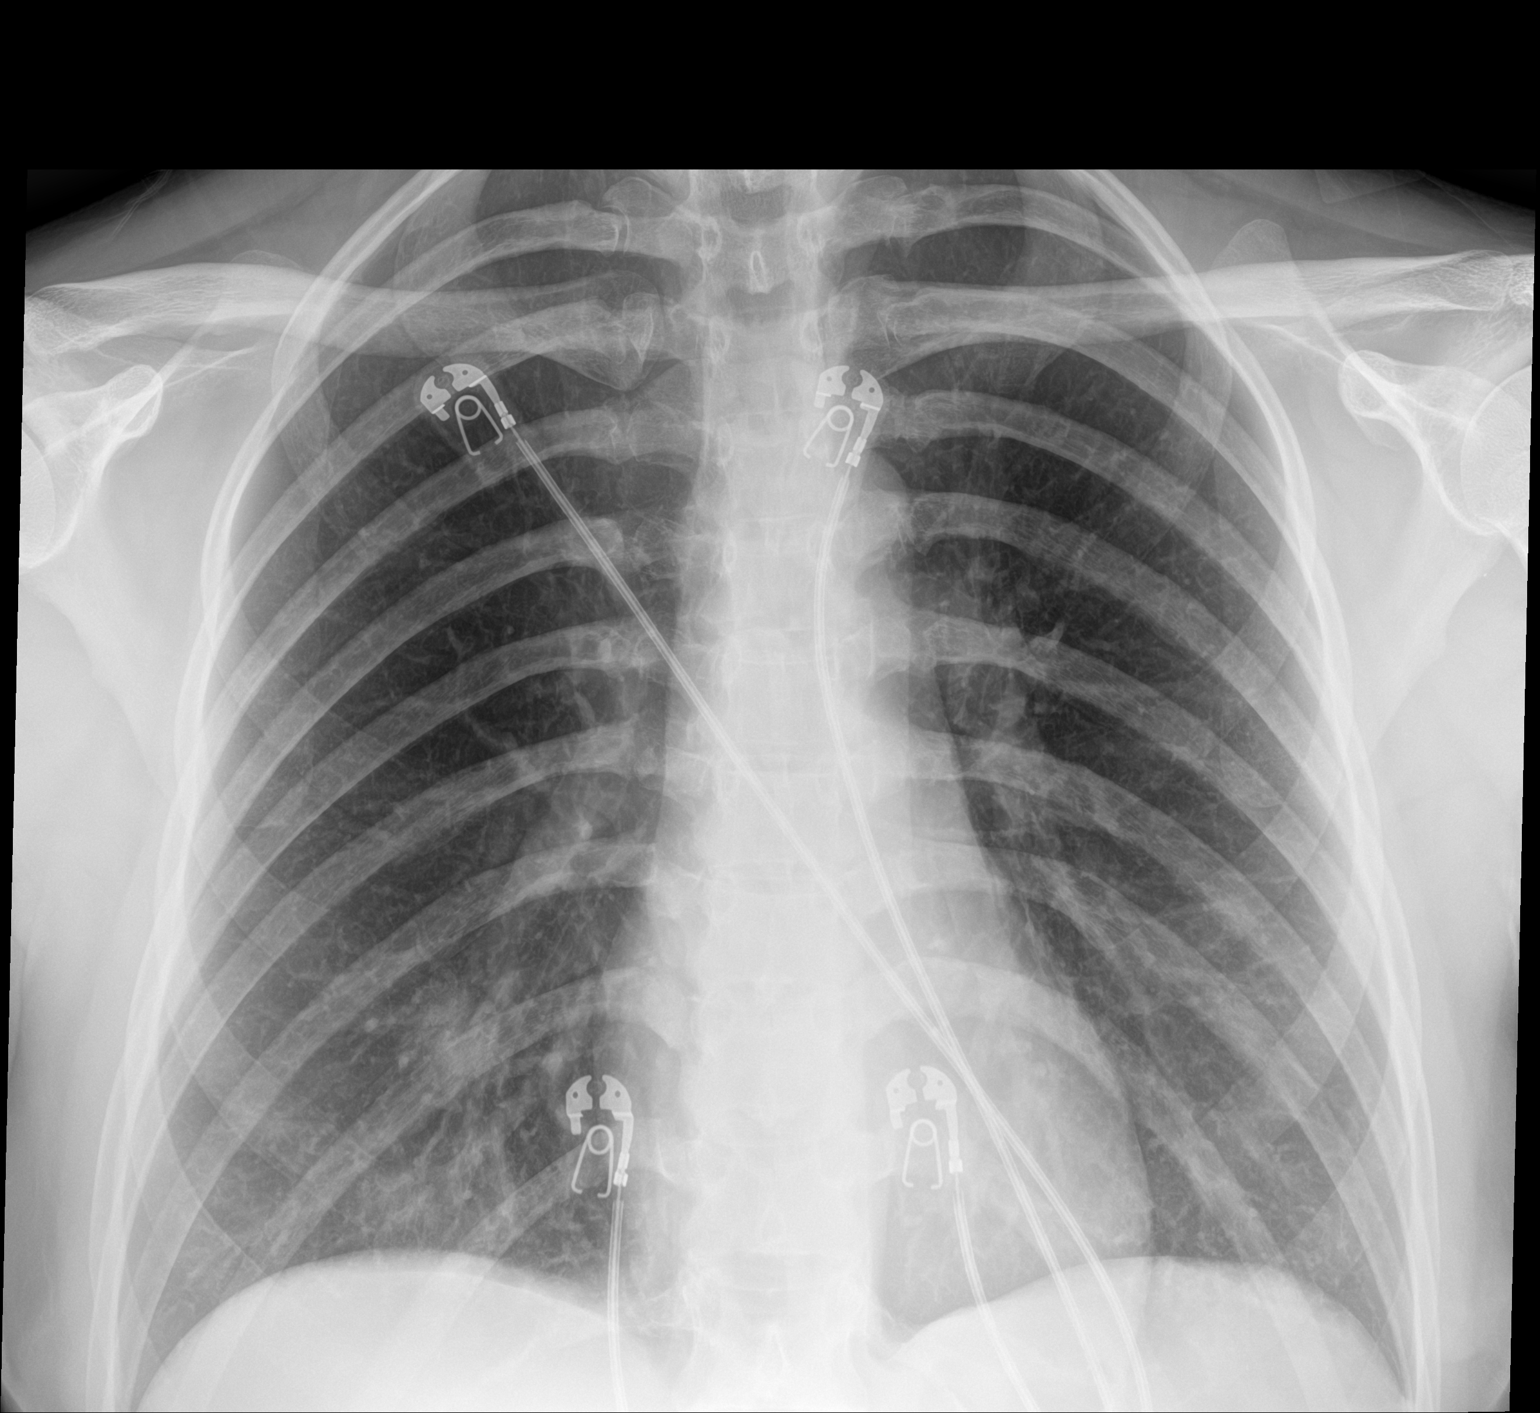

[1 of 1 positions shown; findings below may reference images not displayed]

FINDINGS: Patchy airspace disease is seen in the right lung base. The left
lung is clear. No pneumothorax or pleural effusion. Heart size is
normal.
IMPRESSION: Patchy right basilar airspace disease worrisome for pneumonia
including viral/atypical infection.

## 2021-07-30 DIAGNOSIS — Z79899 Other long term (current) drug therapy: Secondary | ICD-10-CM | POA: Diagnosis not present

## 2021-07-30 DIAGNOSIS — Z7951 Long term (current) use of inhaled steroids: Secondary | ICD-10-CM | POA: Diagnosis not present

## 2021-07-30 DIAGNOSIS — X58XXXA Exposure to other specified factors, initial encounter: Secondary | ICD-10-CM | POA: Diagnosis not present

## 2021-07-30 DIAGNOSIS — E039 Hypothyroidism, unspecified: Secondary | ICD-10-CM | POA: Diagnosis not present

## 2021-07-30 DIAGNOSIS — N840 Polyp of corpus uteri: Secondary | ICD-10-CM | POA: Diagnosis not present

## 2021-07-30 DIAGNOSIS — Z885 Allergy status to narcotic agent status: Secondary | ICD-10-CM | POA: Diagnosis not present

## 2021-07-30 DIAGNOSIS — N83291 Other ovarian cyst, right side: Secondary | ICD-10-CM | POA: Diagnosis not present

## 2021-07-30 DIAGNOSIS — J45909 Unspecified asthma, uncomplicated: Secondary | ICD-10-CM | POA: Diagnosis not present

## 2021-07-30 DIAGNOSIS — N939 Abnormal uterine and vaginal bleeding, unspecified: Secondary | ICD-10-CM | POA: Diagnosis not present

## 2021-07-30 DIAGNOSIS — I10 Essential (primary) hypertension: Secondary | ICD-10-CM | POA: Diagnosis not present

## 2021-07-30 DIAGNOSIS — N838 Other noninflammatory disorders of ovary, fallopian tube and broad ligament: Secondary | ICD-10-CM | POA: Diagnosis not present

## 2021-07-30 DIAGNOSIS — D6851 Activated protein C resistance: Secondary | ICD-10-CM | POA: Diagnosis not present

## 2021-07-30 DIAGNOSIS — N72 Inflammatory disease of cervix uteri: Secondary | ICD-10-CM | POA: Diagnosis not present

## 2021-07-30 DIAGNOSIS — S3141XA Laceration without foreign body of vagina and vulva, initial encounter: Secondary | ICD-10-CM | POA: Diagnosis not present

## 2021-08-18 DIAGNOSIS — F4311 Post-traumatic stress disorder, acute: Secondary | ICD-10-CM | POA: Diagnosis not present

## 2021-09-01 DIAGNOSIS — F4311 Post-traumatic stress disorder, acute: Secondary | ICD-10-CM | POA: Diagnosis not present

## 2021-09-15 DIAGNOSIS — F4311 Post-traumatic stress disorder, acute: Secondary | ICD-10-CM | POA: Diagnosis not present

## 2021-09-22 DIAGNOSIS — F4311 Post-traumatic stress disorder, acute: Secondary | ICD-10-CM | POA: Diagnosis not present

## 2021-10-13 DIAGNOSIS — F4311 Post-traumatic stress disorder, acute: Secondary | ICD-10-CM | POA: Diagnosis not present

## 2021-10-20 DIAGNOSIS — F4311 Post-traumatic stress disorder, acute: Secondary | ICD-10-CM | POA: Diagnosis not present

## 2021-11-10 DIAGNOSIS — F4311 Post-traumatic stress disorder, acute: Secondary | ICD-10-CM | POA: Diagnosis not present

## 2021-11-24 DIAGNOSIS — E063 Autoimmune thyroiditis: Secondary | ICD-10-CM | POA: Diagnosis not present

## 2021-11-24 DIAGNOSIS — R011 Cardiac murmur, unspecified: Secondary | ICD-10-CM | POA: Diagnosis not present

## 2021-12-15 DIAGNOSIS — F4311 Post-traumatic stress disorder, acute: Secondary | ICD-10-CM | POA: Diagnosis not present

## 2022-01-05 DIAGNOSIS — F4311 Post-traumatic stress disorder, acute: Secondary | ICD-10-CM | POA: Diagnosis not present

## 2022-01-26 DIAGNOSIS — F4311 Post-traumatic stress disorder, acute: Secondary | ICD-10-CM | POA: Diagnosis not present

## 2022-02-09 DIAGNOSIS — F4311 Post-traumatic stress disorder, acute: Secondary | ICD-10-CM | POA: Diagnosis not present

## 2022-02-23 DIAGNOSIS — F4311 Post-traumatic stress disorder, acute: Secondary | ICD-10-CM | POA: Diagnosis not present

## 2022-03-02 DIAGNOSIS — Z13220 Encounter for screening for lipoid disorders: Secondary | ICD-10-CM | POA: Diagnosis not present

## 2022-03-02 DIAGNOSIS — Z23 Encounter for immunization: Secondary | ICD-10-CM | POA: Diagnosis not present

## 2022-03-02 DIAGNOSIS — Z0000 Encounter for general adult medical examination without abnormal findings: Secondary | ICD-10-CM | POA: Diagnosis not present

## 2022-03-02 DIAGNOSIS — Z579 Occupational exposure to unspecified risk factor: Secondary | ICD-10-CM | POA: Diagnosis not present

## 2022-03-09 DIAGNOSIS — F4311 Post-traumatic stress disorder, acute: Secondary | ICD-10-CM | POA: Diagnosis not present

## 2022-03-23 DIAGNOSIS — F4311 Post-traumatic stress disorder, acute: Secondary | ICD-10-CM | POA: Diagnosis not present

## 2022-03-30 DIAGNOSIS — F411 Generalized anxiety disorder: Secondary | ICD-10-CM | POA: Diagnosis not present

## 2022-04-06 DIAGNOSIS — F4311 Post-traumatic stress disorder, acute: Secondary | ICD-10-CM | POA: Diagnosis not present

## 2022-04-20 DIAGNOSIS — F4311 Post-traumatic stress disorder, acute: Secondary | ICD-10-CM | POA: Diagnosis not present

## 2022-04-20 DIAGNOSIS — F411 Generalized anxiety disorder: Secondary | ICD-10-CM | POA: Diagnosis not present

## 2022-04-29 DIAGNOSIS — F411 Generalized anxiety disorder: Secondary | ICD-10-CM | POA: Diagnosis not present

## 2022-05-04 DIAGNOSIS — F4311 Post-traumatic stress disorder, acute: Secondary | ICD-10-CM | POA: Diagnosis not present

## 2022-05-04 DIAGNOSIS — F411 Generalized anxiety disorder: Secondary | ICD-10-CM | POA: Diagnosis not present

## 2022-05-25 DIAGNOSIS — F4311 Post-traumatic stress disorder, acute: Secondary | ICD-10-CM | POA: Diagnosis not present

## 2023-05-10 DIAGNOSIS — F4311 Post-traumatic stress disorder, acute: Secondary | ICD-10-CM | POA: Diagnosis not present

## 2023-06-07 DIAGNOSIS — F4311 Post-traumatic stress disorder, acute: Secondary | ICD-10-CM | POA: Diagnosis not present

## 2023-06-21 DIAGNOSIS — F4311 Post-traumatic stress disorder, acute: Secondary | ICD-10-CM | POA: Diagnosis not present

## 2023-06-26 DIAGNOSIS — M542 Cervicalgia: Secondary | ICD-10-CM | POA: Diagnosis not present

## 2023-06-26 DIAGNOSIS — M25519 Pain in unspecified shoulder: Secondary | ICD-10-CM | POA: Diagnosis not present

## 2023-07-05 DIAGNOSIS — F4311 Post-traumatic stress disorder, acute: Secondary | ICD-10-CM | POA: Diagnosis not present

## 2023-07-19 DIAGNOSIS — F4311 Post-traumatic stress disorder, acute: Secondary | ICD-10-CM | POA: Diagnosis not present

## 2023-07-27 DIAGNOSIS — F4311 Post-traumatic stress disorder, acute: Secondary | ICD-10-CM | POA: Diagnosis not present

## 2023-08-16 DIAGNOSIS — F4311 Post-traumatic stress disorder, acute: Secondary | ICD-10-CM | POA: Diagnosis not present

## 2023-08-16 DIAGNOSIS — Z1151 Encounter for screening for human papillomavirus (HPV): Secondary | ICD-10-CM | POA: Diagnosis not present

## 2023-08-16 DIAGNOSIS — Z6826 Body mass index (BMI) 26.0-26.9, adult: Secondary | ICD-10-CM | POA: Diagnosis not present

## 2023-08-16 DIAGNOSIS — Z113 Encounter for screening for infections with a predominantly sexual mode of transmission: Secondary | ICD-10-CM | POA: Diagnosis not present

## 2023-08-16 DIAGNOSIS — Z01419 Encounter for gynecological examination (general) (routine) without abnormal findings: Secondary | ICD-10-CM | POA: Diagnosis not present

## 2023-08-16 DIAGNOSIS — Z1272 Encounter for screening for malignant neoplasm of vagina: Secondary | ICD-10-CM | POA: Diagnosis not present

## 2023-08-30 DIAGNOSIS — F4311 Post-traumatic stress disorder, acute: Secondary | ICD-10-CM | POA: Diagnosis not present

## 2023-09-13 DIAGNOSIS — F4311 Post-traumatic stress disorder, acute: Secondary | ICD-10-CM | POA: Diagnosis not present

## 2023-09-27 DIAGNOSIS — F4311 Post-traumatic stress disorder, acute: Secondary | ICD-10-CM | POA: Diagnosis not present

## 2023-10-18 DIAGNOSIS — F4311 Post-traumatic stress disorder, acute: Secondary | ICD-10-CM | POA: Diagnosis not present

## 2023-11-14 ENCOUNTER — Encounter: Payer: Self-pay | Admitting: Internal Medicine

## 2023-11-14 ENCOUNTER — Ambulatory Visit: Payer: BC Managed Care – PPO | Admitting: Internal Medicine

## 2023-11-14 ENCOUNTER — Other Ambulatory Visit: Payer: Self-pay

## 2023-11-14 VITALS — BP 110/66 | HR 78 | Temp 98.5°F | Ht 73.0 in | Wt 201.1 lb

## 2023-11-14 DIAGNOSIS — J3089 Other allergic rhinitis: Secondary | ICD-10-CM | POA: Diagnosis not present

## 2023-11-14 DIAGNOSIS — J4530 Mild persistent asthma, uncomplicated: Secondary | ICD-10-CM

## 2023-11-14 MED ORDER — ALBUTEROL SULFATE (2.5 MG/3ML) 0.083% IN NEBU: 2.5000 mg | INHALATION_SOLUTION | Freq: Four times a day (QID) | RESPIRATORY_TRACT | 1 refills | Status: AC | PRN

## 2023-11-14 MED ORDER — FLUTICASONE PROPIONATE 50 MCG/ACT NA SUSP: 2.0000 | Freq: Every day | NASAL | 5 refills | Status: AC

## 2023-11-14 MED ORDER — FLUTICASONE-SALMETEROL 115-21 MCG/ACT IN AERO: 2.0000 | INHALATION_SPRAY | Freq: Two times a day (BID) | RESPIRATORY_TRACT | 5 refills | Status: AC

## 2023-11-14 MED ORDER — ALBUTEROL SULFATE HFA 108 (90 BASE) MCG/ACT IN AERS: 2.0000 | INHALATION_SPRAY | Freq: Four times a day (QID) | RESPIRATORY_TRACT | 1 refills | Status: AC | PRN

## 2023-11-14 NOTE — Patient Instructions (Addendum)
 Mild Persistent Asthma: - Maintenance inhaler: start Advair 115-21mcg 2 puffs twice daily during Spring and Fall. If not covered by insurance, please ask them what is. - Rescue inhaler: Albuterol  2 puffs via spacer or 1 vial via nebulizer every 4-6 hours as needed for respiratory symptoms of cough, shortness of breath, or wheezing Asthma control goals:  Full participation in all desired activities (may need albuterol  before activity) Albuterol  use two times or less a week on average (not counting use with activity) Cough interfering with sleep two times or less a month Oral steroids no more than once a year No hospitalizations   Other Allergic Rhinitis: - Use nasal saline rinses before nose sprays such as with Neilmed Sinus Rinse.  Use distilled water.   - Use Flonase  2 sprays each nostril daily. Aim upward and outward. - Use Zyrtec  10 mg daily or Allegra 180mg  daily or Xyzal 5mg  daily.  - Hold all anti-histamines (Xyzal, Allegra, Zyrtec , Claritin, Benadryl, Pepcid ) 3 days prior to next visit.   Follow up: 1/14 at 8:30 AM for skin testing 1-55

## 2023-11-14 NOTE — Progress Notes (Signed)
 NEW PATIENT  Date of Service/Encounter:  11/14/23  Consult requested by: Richarda Tobey LABOR, PA (Inactive)   Subjective:   Tracy Knapp (DOB: [birth year]) is a 33 y.o. female who presents to the clinic on 11/14/2023 with a chief complaint of Allergic Rhinitis , Asthma, and Establish Care .    History obtained from: chart review and patient.   Asthma:  Diagnosed around college, symptoms started even in high school though.  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   Mostly exercise and allergy  induced. Her symptoms are worse in Spring/Fall with coughing and shortness of breath.  Not as much chest tightness/wheezing. This is when she uses the Symbicort; however, Symbicort has gotten expensive though and is asking if there are any alternatives.  rarely daytime symptoms in past month, none nighttime awakenings in past month Using rescue inhaler: none recent but in Fall, required it frequently  Limitations to daily activity: none 1 ED visits/UC visits and 1 oral steroids in the past year 0 number of lifetime hospitalizations, 0 number of lifetime intubations.  Identified Triggers:  allergy  season and exercise Prior PFTs or spirometry: see results review Previously used therapies: ICS inhaler  Current regimen:  Maintenance: Symbicort BID in Spring/Fall  Rescue: Albuterol  2 puffs q4-6 hrs PRN  Rhinitis:  Started since high school.  Moved to [place] and her symptoms have worsened since then.   Symptoms include: nasal congestion, rhinorrhea, post nasal drainage, sneezing, watery eyes, and itchy eyes  Occurs seasonally-Spring/Fall   Potential triggers: cats; works as a administrator, civil service   Treatments tried:  Zyrtec /Allegra/Claritin switching Flonase  twice daily   Previous allergy  testing: no History of sinus surgery: no Nonallergic triggers: none     Reviewed:  07/29/2021: seen by [facility]NT for mycotic otitis externa with ear itching. Use Clotrimazole drops PRN.    05/06/2020: seen by  Ruthell NP for mild persistent asthma, allergic rhinitis.  On Symbicort, Xopenex PRN, Flonsae, Singulair .    05/06/2020: self interpretation of PFT; good effort, normal spirometry, no bronchodilator reversibility. Normal total lung capacity and diffusion capacity.   12/15/20202: seen in ED for COVID with chest tightness/SOB. CT PE negative for PE but showed multifocal PNA. Low suspicion for asthma flare up. D/c home with symptomatic care at home.   Past Medical History: Past Medical History:  Diagnosis Date   Asthma    Complication of anesthesia    wakes up from surgery dysphoric   Factor 5 Leiden mutation, heterozygous (HCC)    Hypothyroidism     Past Surgical History: Past Surgical History:  Procedure Laterality Date   COLONOSCOPY  2014   DILATATION & CURETTAGE/HYSTEROSCOPY WITH MYOSURE N/A 02/14/2018   Procedure: DILATATION & CURETTAGE/HYSTEROSCOPY WITH MYOSURE;  Surgeon: Ozan, Jennifer, DO;  Location: [facility];  Service: Gynecology;  Laterality: N/A;   INGUINAL HERNIA REPAIR  2013   TYMPANOSTOMY TUBE PLACEMENT     WISDOM TOOTH EXTRACTION  2008    Family History: Family History  Problem Relation Age of Onset   Asthma Mother    Allergic rhinitis Mother    Hypothyroidism Mother    Allergic rhinitis Father    Factor V Leiden deficiency Father    Deep vein thrombosis Father    Allergic rhinitis Sister    Factor V Leiden deficiency Sister    Asthma Sister    Allergic rhinitis Brother    Factor V Leiden deficiency Brother     Social History:  Flooring in bedroom: administrator, arts Pets: dogs, birds,  tortoise, cats, gecko Tobacco use/exposure: none Job: vet  Medication List:  Allergies as of 11/14/2023       Reactions   Vicodin [hydrocodone-acetaminophen ]         Medication List        Accurate as of November 14, 2023 11:29 AM. If you have any questions, ask your nurse or doctor.          acetaminophen  325 MG tablet Commonly known as: TYLENOL  Take  325 mg by mouth every 6 (six) hours as needed.   albuterol  (2.5 MG/3ML) 0.083% nebulizer solution Commonly known as: PROVENTIL  Take 2.5 mg by nebulization every 6 (six) hours as needed for wheezing or shortness of breath.   fexofenadine 180 MG tablet Commonly known as: ALLEGRA Take 180 mg by mouth daily.   fluticasone  50 MCG/ACT nasal spray Commonly known as: FLONASE  Place into both nostrils daily.   hydrOXYzine 25 MG tablet Commonly known as: ATARAX Take 25 mg by mouth 2 (two) times daily.   ibuprofen 600 MG tablet Commonly known as: ADVIL Take 600 mg by mouth every 6 (six) hours as needed.   levothyroxine 50 MCG tablet Commonly known as: SYNTHROID Take 50 mcg by mouth daily before breakfast.   metoprolol  tartrate 25 MG tablet Commonly known as: LOPRESSOR  Take 1/2 tablet ( 12.5 mg ) twice a day as needed for palpitations   montelukast  10 MG tablet Commonly known as: SINGULAIR  Take 1 tablet (10 mg total) by mouth at bedtime.   nystatin-triamcinolone cream Commonly known as: MYCOLOG II Apply 1 Application topically as needed.   Symbicort 80-4.5 MCG/ACT inhaler Generic drug: budesonide-formoterol         REVIEW OF SYSTEMS: Pertinent positives and negatives discussed in HPI.   Objective:   Physical Exam: BP 110/66   Pulse 78   Temp 98.5 F (36.9 C)   Ht 6' 1 (1.854 m)   Wt 201 lb 1.6 oz (91.2 kg)   SpO2 97%   BMI 26.53 kg/m  Body mass index is 26.53 kg/m. GEN: alert, well developed HEENT: clear conjunctiva, TM grey and translucent, nose with + mild inferior turbinate hypertrophy, pink nasal mucosa, slight clear rhinorrhea, + cobblestoning HEART: regular rate and rhythm, no murmur LUNGS: clear to auscultation bilaterally, no coughing, unlabored respiration ABDOMEN: soft, non distended  SKIN: no rashes or lesions  Spirometry:  Tracings reviewed. Her effort: Good reproducible efforts. FVC: 4.69L, 92% predicted FEV1: 3.65L, 88% predicted FEV1/FVC  ratio: 78% Interpretation: Spirometry consistent with normal pattern.  Please see scanned spirometry results for details.  Assessment:   1. Other allergic rhinitis   2. Mild persistent asthma without complication     Plan/Recommendations:  Mild Persistent Asthma: - MDI technique discussed.  Spirometry today normal.  - Maintenance inhaler: start Advair 115-21mcg 2 puffs twice daily. If not covered by insurance, please ask them what it is.  Switching from Symbicort due to cost.  - Rescue inhaler: Albuterol  2 puffs via spacer or 1 vial via nebulizer every 4-6 hours as needed for respiratory symptoms of cough, shortness of breath, or wheezing Asthma control goals:  Full participation in all desired activities (may need albuterol  before activity) Albuterol  use two times or less a week on average (not counting use with activity) Cough interfering with sleep two times or less a month Oral steroids no more than once a year No hospitalizations   Other Allergic Rhinitis: - Due to turbinate hypertrophy, seasonal symptoms, flare ups of asthma related to her allergies, and unresponsive  to over the counter meds, will perform skin testing to identify aeroallergen triggers.   - Use nasal saline rinses before nose sprays such as with Neilmed Sinus Rinse.  Use distilled water.   - Use Flonase  2 sprays each nostril daily. Aim upward and outward. - Use Zyrtec  10 mg daily or Allegra 180mg  daily or Xyzal 5mg  daily.  - Hold all anti-histamines (Xyzal, Allegra, Zyrtec , Claritin, Benadryl, Pepcid ) 3 days prior to next visit.   Follow up: 1/14 at 8:30 AM for skin testing 1-55, IDs okay  Arleta Blanch, MD [facility] 

## 2023-11-21 ENCOUNTER — Telehealth: Payer: Self-pay | Admitting: Internal Medicine

## 2023-11-21 ENCOUNTER — Ambulatory Visit: Payer: BC Managed Care – PPO | Admitting: Internal Medicine

## 2023-11-21 DIAGNOSIS — J301 Allergic rhinitis due to pollen: Secondary | ICD-10-CM | POA: Diagnosis not present

## 2023-11-21 DIAGNOSIS — J3081 Allergic rhinitis due to animal (cat) (dog) hair and dander: Secondary | ICD-10-CM | POA: Diagnosis not present

## 2023-11-21 DIAGNOSIS — F4311 Post-traumatic stress disorder, acute: Secondary | ICD-10-CM | POA: Diagnosis not present

## 2023-11-21 DIAGNOSIS — J3089 Other allergic rhinitis: Secondary | ICD-10-CM | POA: Diagnosis not present

## 2023-11-21 MED ORDER — RYALTRIS 665-25 MCG/ACT NA SUSP: 2.0000 | Freq: Two times a day (BID) | NASAL | 5 refills | Status: AC

## 2023-11-21 MED ORDER — CETIRIZINE HCL 10 MG PO TABS: 10.0000 mg | ORAL_TABLET | Freq: Every day | ORAL | 5 refills | Status: AC

## 2023-11-21 NOTE — Patient Instructions (Addendum)
 Mild Persistent Asthma: - Maintenance inhaler: continue Advair 115-21mcg 2 puffs twice daily during Spring and Fall. - Rescue inhaler: Albuterol  2 puffs via spacer or 1 vial via nebulizer every 4-6 hours as needed for respiratory symptoms of cough, shortness of breath, or wheezing Asthma control goals:  Full participation in all desired activities (may need albuterol  before activity) Albuterol  use two times or less a week on average (not counting use with activity) Cough interfering with sleep two times or less a month Oral steroids no more than once a year No hospitalizations   Allergic Rhinitis: - Positive skin test 11/2023: trees, grasses, weeds, molds, cats, dogs, horses  - Avoidance measures discussed. - Use nasal saline rinses before nose sprays such as with Neilmed Sinus Rinse.  Use distilled water.   - Use Ryaltris  2 sprays each nostril twice daily. Aim upward and outward. - Use Zyrtec  10 mg daily or Allegra 180mg  daily or Xyzal 5mg  daily.  - Consider allergy  shots as long term control of your symptoms by teaching your immune system to be more tolerant of your allergy  triggers. Information given on [facility] allergy  shots to discuss cost and coverage with insurance. If interested in starting, please call us  back and we will get it started and Epipen  sent in.     ALLERGEN AVOIDANCE MEASURES  Molds - Indoor avoidance Use air conditioning to reduce indoor humidity.  Do not use a humidifier. Keep indoor humidity at 30 - 40%.  Use a dehumidifier if needed. In the bathroom use an exhaust fan or open a window after showering.  Wipe down damp surfaces after showering.  Clean bathrooms with a mold-killing solution (diluted bleach, or products like Tilex, etc) at least once a month. In the kitchen use an exhaust fan to remove steam from cooking.  Throw away spoiled foods immediately, and empty garbage daily.  Empty water pans below self-defrosting refrigerators frequently. Vent the clothes dryer  to the outside. Limit indoor houseplants; mold grows in the dirt.  No houseplants in the bedroom. Remove carpet from the bedroom. Encase the mattress and box springs with a zippered encasing.  Molds - Outdoor avoidance Avoid being outside when the grass is being mowed, or the ground is tilled. Avoid playing in leaves, pine straw, hay, etc.  Dead plant materials contain mold. Avoid going into barns or grain storage areas. Remove leaves, clippings and compost from around the home.  Pollen Avoidance Pollen levels are highest during the mid-day and afternoon.  Consider this when planning outdoor activities. Avoid being outside when the grass is being mowed, or wear a mask if the pollen-allergic person must be the one to mow the grass. Keep the windows closed to keep pollen outside of the home. Use an air conditioner to filter the air. Take a shower, wash hair, and change clothing after working or playing outdoors during pollen season. Pet Dander Keep the pet out of your bedroom and restrict it to only a few rooms. Be advised that keeping the pet in only one room will not limit the allergens to that room. Don't pet, hug or kiss the pet; if you do, wash your hands with soap and water. High-efficiency particulate air (HEPA) cleaners run continuously in a bedroom or living room can reduce allergen levels over time. Regular use of a high-efficiency vacuum cleaner or a central vacuum can reduce allergen levels. Giving your pet a bath at least once a week can reduce airborne allergen.

## 2023-11-21 NOTE — Telephone Encounter (Signed)
 Patient called stating she is interested in doing [facility] immunotherapy. Patient states it is covered by her insurance. Patient states she is open to going to the [facility] or [facility].

## 2023-11-21 NOTE — Telephone Encounter (Signed)
 Spoke with patient, I offered her 12/15/23 with Dr. Tobie. Unfortunately that date does not work with the patient due to her work schedule. Patient is a little nervous regarding [facility] Immunotherapy, and we were trying to schedule patient on a day Dr. Tobie would be doing [place] however patient stated that she would be fine doing [facility] with another provider. I informed patient I would speak with Dr. Tobie and call back once I have a few available dates. Patient verbalized understanding.

## 2023-11-21 NOTE — Progress Notes (Signed)
 FOLLOW UP Date of Service/Encounter:  11/21/23   Subjective:  Tracy Knapp (DOB: [birth year]) is a 33 y.o. female who [facility] for follow up for skin testing.   History obtained from: chart review and patient.  Anti histamines held.  Not on metoprolol , used it temporarily after she had COVID and tachycardia but no longer has any issues.  No hx of any cardiac dx.   Past Medical History: Past Medical History:  Diagnosis Date   Asthma    Complication of anesthesia    wakes up from surgery dysphoric   Factor 5 Leiden mutation, heterozygous (HCC)    Hypothyroidism     Objective:  There were no vitals taken for this visit. There is no height or weight on file to calculate BMI. Physical Exam: GEN: alert, well developed HEENT: clear conjunctiva, MMM LUNGS: unlabored respiration   Skin Testing:  Skin prick testing was placed, which includes aeroallergens/foods, histamine control, and saline control.  Verbal consent was obtained prior to placing test.  Patient tolerated procedure well.  Allergy  testing results were read and interpreted by myself, documented by clinical staff. Adequate positive and negative control.  Positive results to:  Results discussed with patient/family.  Airborne Adult Perc - 11/21/23 0847     Time Antigen Placed 0847    Allergen Manufacturer Jestine    Location Back    Number of Test 55    1. Control-Buffer 50% Glycerol Negative    2. Control-Histamine 3+    3. Bahia 3+    4. Bermuda 3+    5. Johnson 3+    6. [place]  Blue 3+    7. Meadow Fescue 3+    8. Perennial Rye 3+    9. Timothy Negative    10. Ragweed Mix Negative    11. Cocklebur Negative    12. Plantain,  English 3+    13. Baccharis 3+    14. Dog Fennel Negative    15. Russian Thistle 3+    16. Lamb's Quarters 3+    17. Sheep Sorrell 2+    18. Rough Pigweed 3+    19. Marsh Elder, Rough Negative    20. Mugwort, Common 3+    21. Box,  Elder Negative    22. Cedar, red Negative    23. Sweet Gum Negative    24. Pecan Pollen 3+    25. Pine Mix 3+    26. Walnut, Black Pollen 3+    27. Red Mulberry Negative    28. Ash Mix 3+    29. Birch Mix 2+    30. Beech American 3+    31. Cottonwood, Eastern 3+    32. Hickory, White Negative    33. Maple Mix Negative    34. Oak, Eastern Mix Negative    35. Sycamore Eastern 3+    36. Alternaria Alternata Negative    37. Cladosporium Herbarum Negative    38. Aspergillus Mix Negative    39. Penicillium Mix Negative    40. Bipolaris Sorokiniana (Helminthosporium) Negative    41. Drechslera Spicifera (Curvularia) Negative    42. Mucor Plumbeus Negative    43. Fusarium Moniliforme Negative    44. Aureobasidium Pullulans (pullulara) Negative    45. Rhizopus Oryzae Negative    46. Botrytis Cinera Negative    47. Epicoccum Nigrum Negative    48. Phoma Betae Negative    49. Dust Mite Mix Negative    50. Cat Hair 10,000  BAU/ml Negative    51.  Dog Epithelia Negative    52. Mixed Feathers Negative    53. Horse Epithelia 3+    54. Cockroach, German Negative    55. Tobacco Leaf Negative             Intradermal - 11/21/23 0923     Time Antigen Placed 9076    Allergen Manufacturer Jestine    Location Arm    Number of Test 10    Control Negative    Ragweed Mix 2+    Mold 1 Negative    Mold 2 2+    Mold 3 Negative    Mold 4 2+    Mite Mix Negative    Cat 3+    Dog 3+    Cockroach Negative              Assessment:   1. Seasonal allergic rhinitis due to pollen   2. Allergic rhinitis due to animal hair or dander   3. Allergic rhinitis caused by mold     Plan/Recommendations:  Mild Persistent Asthma: - Maintenance inhaler: start Advair 115-21mcg 2 puffs twice daily during Spring and Fall. If not covered by insurance, please ask them what is. - Rescue inhaler: Albuterol  2 puffs via spacer or 1 vial via nebulizer every 4-6 hours as needed for respiratory symptoms of  cough, shortness of breath, or wheezing Asthma control goals:  Full participation in all desired activities (may need albuterol  before activity) Albuterol  use two times or less a week on average (not counting use with activity) Cough interfering with sleep two times or less a month Oral steroids no more than once a year No hospitalizations   Allergic Rhinitis: - Due to turbinate hypertrophy, seasonal symptoms, flare ups of asthma related to her allergies, and unresponsive to over the counter meds, will perform skin testing to identify aeroallergen triggers.   - Positive skin test 11/2023: trees, grasses, weeds, molds, cats, dogs, horses  - Avoidance measures discussed. - Use nasal saline rinses before nose sprays such as with Neilmed Sinus Rinse.  Use distilled water.   - Use Ryaltris  2 sprays each nostril twice daily. Aim upward and outward. - Use Zyrtec  10 mg daily or Allegra 180mg  daily or Xyzal 5mg  daily.  - Consider allergy  shots as long term control of your symptoms by teaching your immune system to be more tolerant of your allergy  triggers. Risks of life threatening anaphylaxis and benefits discussed.  Alternate is to continue medical therapy as discussed above. Information given on R[facility] and traditional allergy  shots to discuss cost and coverage with insurance. If interested in starting, please call us  back and we will get it started and Epipen  sent in.  Of note, she is a administrator, civil service.       Return in about 3 months (around 02/19/2024).  Arleta Blanch, MD [facility] 

## 2023-11-27 NOTE — Telephone Encounter (Signed)
Patient called back to get updates on any dates that may have come up to get scheduled for R[facility]. I did inform patient that it could take a couple more days. Patient expressed understanding and agreed to call back in seventy two hours to get another update.

## 2023-12-06 DIAGNOSIS — F4310 Post-traumatic stress disorder, unspecified: Secondary | ICD-10-CM | POA: Diagnosis not present

## 2023-12-15 ENCOUNTER — Encounter (HOSPITAL_COMMUNITY): Payer: Self-pay | Admitting: Emergency Medicine

## 2023-12-15 ENCOUNTER — Emergency Department (HOSPITAL_COMMUNITY)
Admission: EM | Admit: 2023-12-15 | Discharge: 2023-12-15 | Disposition: A | Discharge status: Home / Self Care | Payer: BC Managed Care – PPO | Attending: Emergency Medicine | Admitting: Emergency Medicine

## 2023-12-15 ENCOUNTER — Emergency Department (HOSPITAL_COMMUNITY): Payer: BC Managed Care – PPO

## 2023-12-15 ENCOUNTER — Other Ambulatory Visit: Payer: Self-pay

## 2023-12-15 DIAGNOSIS — R1031 Right lower quadrant pain: Secondary | ICD-10-CM | POA: Diagnosis not present

## 2023-12-15 DIAGNOSIS — K429 Umbilical hernia without obstruction or gangrene: Secondary | ICD-10-CM | POA: Diagnosis not present

## 2023-12-15 DIAGNOSIS — J45909 Unspecified asthma, uncomplicated: Secondary | ICD-10-CM | POA: Insufficient documentation

## 2023-12-15 DIAGNOSIS — R1030 Lower abdominal pain, unspecified: Secondary | ICD-10-CM | POA: Diagnosis not present

## 2023-12-15 DIAGNOSIS — Z7951 Long term (current) use of inhaled steroids: Secondary | ICD-10-CM | POA: Insufficient documentation

## 2023-12-15 DIAGNOSIS — E039 Hypothyroidism, unspecified: Secondary | ICD-10-CM | POA: Diagnosis not present

## 2023-12-15 DIAGNOSIS — K469 Unspecified abdominal hernia without obstruction or gangrene: Secondary | ICD-10-CM | POA: Diagnosis not present

## 2023-12-15 DIAGNOSIS — R110 Nausea: Secondary | ICD-10-CM | POA: Diagnosis not present

## 2023-12-15 DIAGNOSIS — S3981XA Other specified injuries of abdomen, initial encounter: Secondary | ICD-10-CM

## 2023-12-15 LAB — CBC
HCT: 41.7 % (ref 36.0–46.0)
Hemoglobin: 13.7 g/dL (ref 12.0–15.0)
MCH: 31.5 pg (ref 26.0–34.0)
MCHC: 32.9 g/dL (ref 30.0–36.0)
MCV: 95.9 fL (ref 80.0–100.0)
Platelets: 250 10*3/uL (ref 150–400)
RBC: 4.35 MIL/uL (ref 3.87–5.11)
RDW: 11.9 % (ref 11.5–15.5)
WBC: 9.5 10*3/uL (ref 4.0–10.5)
nRBC: 0 % (ref 0.0–0.2)

## 2023-12-15 LAB — COMPREHENSIVE METABOLIC PANEL
ALT: 23 U/L (ref 0–44)
AST: 23 U/L (ref 15–41)
Albumin: 4 g/dL (ref 3.5–5.0)
Alkaline Phosphatase: 44 U/L (ref 38–126)
Anion gap: 8 (ref 5–15)
BUN: 13 mg/dL (ref 6–20)
CO2: 23 mmol/L (ref 22–32)
Calcium: 9.1 mg/dL (ref 8.9–10.3)
Chloride: 106 mmol/L (ref 98–111)
Creatinine, Ser: 0.88 mg/dL (ref 0.44–1.00)
GFR, Estimated: >60 mL/min (ref >60)
Glucose, Bld: 121 mg/dL — ABNORMAL HIGH (ref 70–99)
Potassium: 4.1 mmol/L (ref 3.5–5.1)
Sodium: 137 mmol/L (ref 135–145)
Total Bilirubin: 0.6 mg/dL (ref 0.0–1.2)
Total Protein: 7 g/dL (ref 6.5–8.1)

## 2023-12-15 LAB — LIPASE, BLOOD: Lipase: 32 U/L (ref 11–51)

## 2023-12-15 MED ORDER — OXYCODONE-ACETAMINOPHEN 5-325 MG PO TABS
1.0000 | ORAL_TABLET | Freq: Once | ORAL | Status: AC
  Administered 2023-12-15: 1 via ORAL
  Filled 2023-12-15: qty 1

## 2023-12-15 MED ORDER — FENTANYL CITRATE PF 50 MCG/ML IJ SOSY
50.0000 ug | PREFILLED_SYRINGE | Freq: Once | INTRAMUSCULAR | Status: AC
  Administered 2023-12-15: 50 ug via INTRAVENOUS
  Filled 2023-12-15: qty 1

## 2023-12-15 MED ORDER — IOHEXOL 350 MG/ML SOLN
75.0000 mL | Freq: Once | INTRAVENOUS | Status: AC | PRN
  Administered 2023-12-15: 75 mL via INTRAVENOUS

## 2023-12-15 MED ORDER — OXYCODONE-ACETAMINOPHEN 5-325 MG PO TABS: 1.0000 | ORAL_TABLET | Freq: Four times a day (QID) | ORAL | 0 refills | Status: AC | PRN

## 2023-12-15 MED ORDER — LIDOCAINE 5 % EX PTCH
1.0000 | MEDICATED_PATCH | CUTANEOUS | Status: DC
  Administered 2023-12-15: 1 via TRANSDERMAL
  Filled 2023-12-15: qty 1

## 2023-12-15 MED ORDER — ONDANSETRON HCL 4 MG/2ML IJ SOLN
4.0000 mg | Freq: Once | INTRAMUSCULAR | Status: AC
  Administered 2023-12-15: 4 mg via INTRAVENOUS
  Filled 2023-12-15: qty 2

## 2023-12-15 MED ORDER — OXYCODONE HCL 5 MG PO TABS: 5.0000 mg | ORAL_TABLET | Freq: Once | ORAL | Status: DC

## 2023-12-15 MED ORDER — KETOROLAC TROMETHAMINE 15 MG/ML IJ SOLN: 15.0000 mg | Freq: Once | INTRAMUSCULAR | Status: DC

## 2023-12-15 NOTE — ED Provider Notes (Signed)
 Care of the patient received from Sponsellar PA-C. Please see her note for full HPI  In short, 32 year with a history of asthma, factor V Leiden mutation, hypothyroidism presented with complains of right lower quadrant pain x 72 hours.  History of hysterectomy in 2019 and right sided inguinal hernia repair.  Lab work on arrival vitals are unremarkable, care signed out pending CT of the abdomen pelvis.  She was given fentanyl  and Zofran  for pain and nausea Physical Exam  BP (!) 124/113 (BP Location: Right Arm)   Pulse 69   Temp 97.6 F (36.4 C)   Resp 17   LMP 01/03/2018 (Exact Date) Comment: u preg ordered  SpO2 100%   Physical Exam Vitals and nursing note reviewed.  Constitutional:      General: She is not in acute distress.    Appearance: She is well-developed.     Comments:    HENT:     Head: Normocephalic and atraumatic.  Eyes:     Conjunctiva/sclera: Conjunctivae normal.  Cardiovascular:     Rate and Rhythm: Normal rate and regular rhythm.     Heart sounds: No murmur heard. Pulmonary:     Effort: Pulmonary effort is normal. No respiratory distress.     Breath sounds: Normal breath sounds.  Abdominal:     Palpations: Abdomen is soft.     Tenderness: There is no abdominal tenderness.       Comments: Point tenderness over right pubic bone   Musculoskeletal:        General: Tenderness present. No swelling.     Cervical back: Neck supple.  Skin:    General: Skin is warm and dry.     Capillary Refill: Capillary refill takes less than 2 seconds.  Neurological:     General: No focal deficit present.     Mental Status: She is alert and oriented to person, place, and time.  Psychiatric:        Mood and Affect: Mood normal.     Procedures  Procedures  ED Course / MDM   Clinical Course as of 12/15/23 0754  Fri Dec 15, 2023  0715 CT ABDOMEN PELVIS W CONTRAST IMPRESSION: 1. No acute findings in the abdomen or pelvis. Specifically, no right adnexal mass. Terminal  ileum unremarkable. The appendix is not visualized, but there is no edema or inflammation in the region of the cecum. No free fluid in the pelvis. 2. Asymmetry in the pubic bones with cortical irregularity and ill-defined asymmetric soft tissue anterior to the cortex of the right pubic bone near the pubis symphysis. Although subtle, the appearance raises the question of sports hernia/athletic pubalgia. Correlation for point tenderness in this region may prove helpful. If clinical picture is equivocal, a follow-up outpatient nonemergent MRI of the pelvis may prove helpful to further evaluate. 3. Small umbilical hernia contains only fat.   [MB]  [id number] Discussed CT scan findings with patient. Suspect musculoskeletal injury/sports hernia. Given location of pain, I have low suspicion for ovarian torsion (patient has a h/o hystorectomy). Pain is reproducible over the pelvic bone. I did offer pelvic US  for completeness sake but patient and I both agreed that most likely her symptoms are musculoskeletal in nature.  She states that her pain had been slightly controlled with the fentanyl  but has now worsened after going to the CT scanner.  She has taken 800 mg of ibuprofen 2 hours ago, will avoid additional Toradol .  Will trial Percocet and lidocaine  patch, patient is agreeable to  this.  Will send home with several Percocet pills for pain control. PDMP reviewed, appropriate.  We discussed following up with her PCP for outpatient MRI. Discussed return precautions. She voiced understanding and is agreeable. Stable for discharge   [MB]    Clinical Course User Index [MB] Vonn Hadassah LABOR, PA-C   Medical Decision Making Amount and/or Complexity of Data Reviewed Labs: ordered. Radiology: ordered.  Risk Prescription drug management.          Vonn Hadassah LABOR, PA-C 12/15/23 9245    Freddi Hamilton, MD 12/15/23 [phone]

## 2023-12-15 NOTE — Discharge Instructions (Addendum)
 You were evaluated in the Emergency Department and after careful evaluation, we did not find any emergent condition requiring admission or further testing in the hospital.  Your CT scan showed some inflammation in the muscles near your right pubic bone, which is most likely a sports hernia.  Please see the handout for more information.  I have also provided a handout with some stretches and exercises to help with your pain.  Please continue to take ibuprofen as needed for pain, though do not exceed taking this consistently for more than 1 week as this can cause stomach ulcers and kidney injury.  You may take Percocet for breakthrough pain.  Please follow-up with your primary care doctor for MRI of your pelvis.   Please return to the Emergency Department if you experience any worsening of your condition. Thank you for allowing us  to be a part of your care.

## 2023-12-15 NOTE — ED Triage Notes (Signed)
 Patient reports RLQ abdominal pain onset this week with mild nausea , no emesis or diarrhea , no fever or chills .

## 2023-12-15 NOTE — ED Notes (Signed)
 This RN reviewed discharge instructions with patient. She verbalized understanding and denied any further questions. PT well appearing upon discharge and reports tolerable pain. Pt wheeled out to lobby and is waiting on ride from girlfriend. Pt endorses ride home.

## 2023-12-15 NOTE — ED Provider Notes (Signed)
 [facility] Provider Note   CSN: [id number] Arrival date & time: 12/15/23  0422     History  Chief Complaint  Patient presents with   Abdominal Pain    Tracy Knapp is a 33 y.o. female who presents with concern for progressively worsening right lower quadrant abdominal pain for the last 72 hours with associated mild nausea but no emesis or diarrhea, normal urinary habits, no vaginal bleeding or discharge, status post hysterectomy in 2019 for polyps.  Patient with history of inguinal hernia repair on R, abdominal hysterectomy .  The states she is recently been working out and was concerned that she may have reinjured the site of her prior hernia.  Of note patient with height of 6 foot 1 inch, has been tested for Marfan syndrome in the past which was negative, she denies any known family history of vascular anomalies.  Connective tissue disorders.  HPI     Home Medications Prior to Admission medications   Medication Sig Start Date End Date Taking? Authorizing Provider  acetaminophen  (TYLENOL ) 325 MG tablet Take 325 mg by mouth every 6 (six) hours as needed. 07/30/21   [provider]  albuterol  (PROVENTIL ) (2.5 MG/3ML) 0.083% nebulizer solution Take 2.5 mg by nebulization every 6 (six) hours as needed for wheezing or shortness of breath.    [provider]  albuterol  (PROVENTIL ) (2.5 MG/3ML) 0.083% nebulizer solution Take 3 mLs (2.5 mg total) by nebulization every 6 (six) hours as needed for shortness of breath or wheezing. 11/14/23   Tobie Arleta SQUIBB, MD  albuterol  (VENTOLIN  HFA) 108 (90 Base) MCG/ACT inhaler Inhale 2 puffs into the lungs every 6 (six) hours as needed for shortness of breath or wheezing. 11/14/23   Tobie Arleta SQUIBB, MD  cetirizine  (ZYRTEC  ALLERGY ) 10 MG tablet Take 1 tablet (10 mg total) by mouth daily. 11/21/23   Tobie Arleta SQUIBB, MD  fexofenadine (ALLEGRA) 180 MG tablet Take 180 mg by mouth daily. Patient not  taking: Reported on 11/14/2023    [provider]  fluticasone  (FLONASE ) 50 MCG/ACT nasal spray Place 2 sprays into both nostrils daily. 11/14/23   Tobie Arleta SQUIBB, MD  fluticasone -salmeterol (ADVAIR HFA) 115-21 MCG/ACT inhaler Inhale 2 puffs into the lungs 2 (two) times daily. 11/14/23   Tobie Arleta SQUIBB, MD  hydrOXYzine (ATARAX/VISTARIL) 25 MG tablet Take 25 mg by mouth 2 (two) times daily. 01/05/20   [provider]  ibuprofen (ADVIL) 600 MG tablet Take 600 mg by mouth every 6 (six) hours as needed. 07/30/21   [provider]  levothyroxine (SYNTHROID, LEVOTHROID) 50 MCG tablet Take 50 mcg by mouth daily before breakfast.    [provider]  metoprolol  tartrate (LOPRESSOR ) 25 MG tablet Take 1/2 tablet ( 12.5 mg ) twice a day as needed for palpitations Patient not taking: Reported on 11/14/2023 05/05/20   Rodriguez-Guzman, Raquel, RPH-CPP  montelukast  (SINGULAIR ) 10 MG tablet Take 1 tablet (10 mg total) by mouth at bedtime. Patient not taking: Reported on 11/14/2023 03/18/20   Gretta Leita SQUIBB, DO  nystatin-triamcinolone (MYCOLOG II) cream Apply 1 Application topically as needed.    [provider]  Olopatadine-Mometasone (RYALTRIS ) 665-25 MCG/ACT SUSP Place 2 sprays into the nose in the morning and at bedtime. 11/21/23   Tobie Arleta SQUIBB, MD  SYMBICORT 80-4.5 MCG/ACT inhaler  03/11/20   [provider]      Allergies    Vicodin [hydrocodone-acetaminophen ]    Review of Systems   Review  of Systems  Constitutional: Negative.   HENT: Negative.    Gastrointestinal:  Positive for abdominal pain and nausea. Negative for diarrhea and vomiting.  Genitourinary: Negative.     Physical Exam Updated Vital Signs BP (!) 140/74 (BP Location: Right Arm)   Pulse 85   Temp 97.6 F (36.4 C)   Resp 16   LMP 01/03/2018 (Exact Date) Comment: u preg ordered  SpO2 100%  Physical Exam Vitals and nursing note reviewed.  Constitutional:      Appearance: She is not  ill-appearing or toxic-appearing.  HENT:     Head: Normocephalic and atraumatic.     Mouth/Throat:     Mouth: Mucous membranes are moist.     Pharynx: No oropharyngeal exudate or posterior oropharyngeal erythema.  Eyes:     General:        Right eye: No discharge.        Left eye: No discharge.     Conjunctiva/sclera: Conjunctivae normal.  Cardiovascular:     Rate and Rhythm: Normal rate and regular rhythm.     Pulses: Normal pulses.     Heart sounds: Normal heart sounds. No murmur heard. Pulmonary:     Effort: Pulmonary effort is normal. No respiratory distress.     Breath sounds: Normal breath sounds. No wheezing or rales.  Abdominal:     General: Bowel sounds are normal. There is no distension.     Palpations: Abdomen is soft.     Tenderness: There is abdominal tenderness in the right lower quadrant. There is no right CVA tenderness, left CVA tenderness, guarding or rebound. Positive signs include McBurney's sign, psoas sign and obturator sign. Negative signs include Murphy's sign and Rovsing's sign.  Musculoskeletal:        General: No deformity.     Cervical back: Neck supple.  Skin:    General: Skin is warm and dry.  Neurological:     Mental Status: She is alert. Mental status is at baseline.  Psychiatric:        Mood and Affect: Mood normal.     ED Results / Procedures / Treatments   Labs (all labs ordered are listed, but only abnormal results are displayed) Labs Reviewed  COMPREHENSIVE METABOLIC PANEL - Abnormal; Notable for the following components:      Result Value   Glucose, Bld 121 (*)    All other components within normal limits  CBC  LIPASE, BLOOD  URINALYSIS, ROUTINE W REFLEX MICROSCOPIC    EKG None  Radiology No results found.  Procedures Procedures    Medications Ordered in ED Medications  fentaNYL  (SUBLIMAZE ) injection 50 mcg (50 mcg Intravenous Given 12/15/23 0547)  ondansetron  (ZOFRAN ) injection 4 mg (4 mg Intravenous Given 12/15/23 0546)     ED Course/ Medical Decision Making/ A&P                                 Medical Decision Making 33 y/o female with RLQ pain x 3 days.   HTN on intake, cardiopulmonary exam is unremarkable, abdominal exam as above with RLQ TTP.   The differential diagnosis for RUQ includes but is not limited to:  Cholelithiasis / choledocholithiasis / cholecystitis / cholangitis, hepatitis (eg. viral, alcoholic, toxic),liver abscess, pancreatitis, liver / pancreatic / biliary tract cancer, ischemic hepatopathy (shock liver), hepatic vein obstruction (Budd-Chiari syndrome), liver cell adenoma, peptic ulcer disease (duodenal), functional or nonulcer dyspepsia, right lower lobe pneumonia, pyelonephritis, urinary  calculi,  Fitz-Hugh-Curtis syndrome (with pelvic inflammatory disease), herpes zoster, trauma or musculoskeletal pain, herniated disk, abdominal abscess, intestinal ischemia, physical or sexual abuse, ectopic pregnancy, IUP, Mittelschmerz, ovarian cyst/torsion, threatened/ievitable abortion, PID, endometriosis, molar pregnancy, heterotopic pregnancy, corpus luteum cyst, appendicitis, UTI/renal colic, IBD, hernia.    Amount and/or Complexity of Data Reviewed Labs: ordered.    Details: CBC, CMP, lipase unremarkable.  Radiology: ordered.  Risk Prescription drug management.   Care of this patient signed out to oncoming ED provider EMERSON Felt, PA-C at time of shift change. All pertinent HPI, physical exam, and laboratory findings were discussed with them prior to my departure. Disposition of patient pending completion of workup, reevaluation, and clinical judgement of oncoming ED provider.   This chart was dictated using voice recognition software, Dragon. Despite the best efforts of this provider to proofread and correct errors, errors may still occur which can change documentation meaning.          Final Clinical Impression(s) / ED Diagnoses Final diagnoses:  None    Rx / DC Orders ED  Discharge Orders     None         Bobette Pleasant SAUNDERS, PA-C 12/15/23 0623    Emil Share, DO 12/15/23 [phone]

## 2023-12-18 DIAGNOSIS — S3981XD Other specified injuries of abdomen, subsequent encounter: Secondary | ICD-10-CM | POA: Diagnosis not present

## 2023-12-20 DIAGNOSIS — F4310 Post-traumatic stress disorder, unspecified: Secondary | ICD-10-CM | POA: Diagnosis not present

## 2023-12-22 ENCOUNTER — Other Ambulatory Visit: Payer: Self-pay | Admitting: Internal Medicine

## 2023-12-22 DIAGNOSIS — J3089 Other allergic rhinitis: Secondary | ICD-10-CM

## 2023-12-22 DIAGNOSIS — J301 Allergic rhinitis due to pollen: Secondary | ICD-10-CM

## 2023-12-22 DIAGNOSIS — J3081 Allergic rhinitis due to animal (cat) (dog) hair and dander: Secondary | ICD-10-CM

## 2023-12-22 MED ORDER — PREDNISONE 20 MG PO TABS: ORAL_TABLET | ORAL | 0 refills | Status: AC

## 2023-12-22 MED ORDER — EPINEPHRINE 0.3 MG/0.3ML IJ SOAJ: 0.3000 mg | INTRAMUSCULAR | 1 refills | Status: AC | PRN

## 2023-12-22 MED ORDER — MONTELUKAST SODIUM 10 MG PO TABS: ORAL_TABLET | ORAL | 0 refills | Status: AC

## 2023-12-22 NOTE — Progress Notes (Signed)
[facility] Rx signed. Epipen Sent [facility] orders sent- prednisone, singulair. Get Zyrtec/Pepcid OTC.     [facility] Pre-Medication Instructions for Patients  DAY BEFORE [facility] IMMUNOTHERAPY: Morning Medications:  Choose one: Zyrtec 10 mg, Claritin 10 mg, Xyzal 5 mg or Allegra 180 mg (over the counter) Pepcid 20 mg (over the counter)  Prednisone 40 mg in the morning Singulair (montelukast) 10 mg in the morning  Evening Medications:  Pepcid 20 mg (over the counter)    DAY OF [facility] IMMUNOTHERAPY: Morning Medications:  Choose one: Zyrtec 10 mg, Claritin 10 mg, Xyzal 5 mg or Allegra 180 mg (over the counter) Pepcid 20 mg (over the counter)  Prednisone 40 mg in the morning  Singulair (montelukast) 10 mg in the morning  Evening Medications:  Pepcid 20 mg (over the counter)    Bring your Epipen/AuviQ with you to the clinic! Please arrive no later than 8:15am for your rush appointment Make sure to bring food (lunch, snacks, etc), and activities to keep you occupied for the day!   You cannot leave the clinic until the entire appointment is over (~8 hours).

## 2023-12-25 DIAGNOSIS — J3081 Allergic rhinitis due to animal (cat) (dog) hair and dander: Secondary | ICD-10-CM | POA: Diagnosis not present

## 2023-12-25 NOTE — Progress Notes (Signed)
Aeroallergen Immunotherapy  Ordering Provider: Alesia Morin  Patient Details Name: Rayli Wiederhold MRN: [id number] Date of Birth: 1991/02/15  Order 2 of 2  Vial Label: cats, mold  0.2 ml (Volume)  1:10 Concentration -- Aspergillus mix 0.2 ml (Volume)  1:10 Concentration -- Penicillium mix 0.2 ml (Volume)  1:10 Concentration -- Fusarium moniliforme 0.2 ml (Volume)  1:40 Concentration -- Aureobasidium pullulans 0.2 ml (Volume)  1:10 Concentration -- Rhizopus oryzae 0.5 ml (Volume)  1:10 Concentration -- Cat Hair   1.5  ml Extract Subtotal 3.5  ml Diluent 5.0  ml Maintenance Total  Schedule:  [facility] Silver Vial (1:1,000,000): [facility] Blue Vial (1:100,000): RUSH Yellow Vial (1:10,000): R[facility] Green Vial (1:1,000): Schedule B (6 doses) Red Vial (1:100): Schedule A (10 doses)  Special Instructions: Bring Epipen and premedicate for [facility].

## 2023-12-25 NOTE — Progress Notes (Signed)
VIAL SET ONE MADE 12-25-23. EXP 12-24-24.

## 2023-12-25 NOTE — Progress Notes (Signed)
Aeroallergen Immunotherapy   Ordering Provider: Alesia Morin   Patient Details  Name: Tracy Knapp  MRN: [id number]  Date of Birth: [birth year]   Order 1 of 2   Vial Label: TGW,D,H   0.3 ml (Volume)  BAU Concentration -- 7 Grass Mix* 100,000 ([address] [place], [place], [place], [place], Red[place], Sweet Vernal, Timothy)  0.2 ml (Volume)  1:20 Concentration -- [place]  0.3 ml (Volume)  BAU Concentration -- [place] 10,000  0.2 ml (Volume)  1:20 Concentration -- [place]  0.3 ml (Volume)  1:20 Concentration -- Ragweed Mix  0.2 ml (Volume)  1:10 Concentration -- Plantain English  0.2 ml (Volume)  1:20 Concentration -- [place] Thistle  0.2 ml (Volume)  1:40 Concentration -- Baccharis  0.5 ml (Volume)  1:20 Concentration -- Weed Mix*  0.5 ml (Volume)  1:20 Concentration -- Eastern 10 Tree Mix (also Sweet Gum)  0.2 ml (Volume)  1:10 Concentration -- Pecan Pollen  0.2 ml (Volume)  1:10 Concentration -- Pine Mix  0.2 ml (Volume)  1:20 Concentration -- Red Mulberry  0.2 ml (Volume)  1:20 Concentration -- Walnut, Black Pollen  0.5 ml (Volume)  1:10 Concentration -- Dog Epithelia  0.3 ml (Volume)  1:10 Concentration -- Horse Epithelia    4.5  ml Extract Subtotal  0.5  ml Diluent  5.0  ml Maintenance Total   Schedule:  [facility]USH  Silver Vial (1:1,000,000): RUSH  Blue Vial (1:100,000): RUSH  Yellow Vial (1:10,000): RUSH  Green Vial (1:1,000): Schedule B (6 doses)  Red Vial (1:100): Schedule A (10 doses)   Special Instructions: Bring Epipen and make sure to premedicate for RUSH.

## 2023-12-26 DIAGNOSIS — J302 Other seasonal allergic rhinitis: Secondary | ICD-10-CM | POA: Diagnosis not present

## 2023-12-26 NOTE — Progress Notes (Signed)
VIAL TWO MADE 12-26-23. EXP 12-25-24.

## 2024-01-03 MED ORDER — FAMOTIDINE 20 MG PO TABS: ORAL_TABLET | ORAL | 0 refills | Status: AC

## 2024-01-03 NOTE — Addendum Note (Signed)
 Addended by: Dub Mikes on: 01/03/2024 10:44 AM   Modules accepted: Orders

## 2024-01-10 DIAGNOSIS — F4310 Post-traumatic stress disorder, unspecified: Secondary | ICD-10-CM | POA: Diagnosis not present

## 2024-02-01 DIAGNOSIS — F4310 Post-traumatic stress disorder, unspecified: Secondary | ICD-10-CM | POA: Diagnosis not present

## 2024-02-02 NOTE — Progress Notes (Deleted)
 RAPID DESENSITIZATION Note  RE: Tracy Knapp MRN: [id number] DOB: 1991-10-31 Date of Office Visit: 02/05/2024  Subjective:  Patient presents today for rapid desensitization.  Interval History: Patient has not been ill, she has taken all premedications as per protocol.  Recent/Current History: Pulmonary disease: {Blank single:19197::"yes","no"} Cardiac disease: {Blank single:19197::"yes","no"} Respiratory infection: {Blank single:19197::"yes","no"} Rash: {Blank single:19197::"yes","no"} Itch: {Blank single:19197::"yes","no"} Swelling: {Blank single:19197::"yes","no"} Cough: {Blank single:19197::"yes","no"} Shortness of breath: {Blank single:19197::"yes","no"} Runny/stuffy nose: {Blank single:19197::"yes","no"} Itchy eyes: {Blank single:19197::"yes","no"} Beta-blocker use: {Blank single:19197::"yes","no"}  Patient/guardian was informed of the procedure with verbalized understanding of the risk of anaphylaxis. Consent has been signed.   Medication List:  Current Outpatient Medications  Medication Sig Dispense Refill   acetaminophen (TYLENOL) 325 MG tablet Take 325 mg by mouth every 6 (six) hours as needed.     albuterol (PROVENTIL) (2.5 MG/3ML) 0.083% nebulizer solution Take 2.5 mg by nebulization every 6 (six) hours as needed for wheezing or shortness of breath.     albuterol (PROVENTIL) (2.5 MG/3ML) 0.083% nebulizer solution Take 3 mLs (2.5 mg total) by nebulization every 6 (six) hours as needed for shortness of breath or wheezing. 75 mL 1   albuterol (VENTOLIN HFA) 108 (90 Base) MCG/ACT inhaler Inhale 2 puffs into the lungs every 6 (six) hours as needed for shortness of breath or wheezing. 8 g 1   cetirizine (ZYRTEC ALLERGY) 10 MG tablet Take 1 tablet (10 mg total) by mouth daily. 30 tablet 5   EPINEPHrine (EPIPEN 2-PAK) 0.3 mg/0.3 mL IJ SOAJ injection Inject 0.3 mg into the muscle as needed for anaphylaxis. 2 each 1   famotidine (PEPCID) 20 MG tablet Take 20 mg the morning  and evening before and 20 mg the morning and evening of [facility]. 4 tablet 0   fexofenadine (ALLEGRA) 180 MG tablet Take 180 mg by mouth daily. (Patient not taking: Reported on 11/14/2023)     fluticasone (FLONASE) 50 MCG/ACT nasal spray Place 2 sprays into both nostrils daily. 16 g 5   fluticasone-salmeterol (ADVAIR HFA) 115-21 MCG/ACT inhaler Inhale 2 puffs into the lungs 2 (two) times daily. 1 each 5   hydrOXYzine (ATARAX/VISTARIL) 25 MG tablet Take 25 mg by mouth 2 (two) times daily.     ibuprofen (ADVIL) 600 MG tablet Take 600 mg by mouth every 6 (six) hours as needed.     levothyroxine (SYNTHROID, LEVOTHROID) 50 MCG tablet Take 50 mcg by mouth daily before breakfast.     metoprolol tartrate (LOPRESSOR) 25 MG tablet Take 1/2 tablet ( 12.5 mg ) twice a day as needed for palpitations (Patient not taking: Reported on 11/14/2023) 30 tablet 6   montelukast (SINGULAIR) 10 MG tablet Take 1 tablet (10 mg total) by mouth at bedtime. (Patient not taking: Reported on 11/14/2023) 30 tablet 11   montelukast (SINGULAIR) 10 MG tablet Take Singulair 10mg  (1 tablet) day prior to [facility] allergy shot and day of [facility] allergy shot. 2 tablet 0   nystatin-triamcinolone (MYCOLOG II) cream Apply 1 Application topically as needed.     Olopatadine-Mometasone (RYALTRIS) [id number] MCG/ACT SUSP Place 2 sprays into the nose in the morning and at bedtime. 29 g 5   predniSONE (DELTASONE) 20 MG tablet Take prednisone 40mg  (2 tablets) day prior to [facility] allergy shot and day of [facility] allergy shot. 4 tablet 0   SYMBICORT 80-4.5 MCG/ACT inhaler      No current facility-administered medications for this visit.   Allergies: Allergies  Allergen Reactions   Vicodin [Hydrocodone-Acetaminophen]    I reviewed her  past medical history, social history, family history, and environmental history and no significant changes have been reported from her previous visit.  ROS: Negative except as per HPI.  Objective: LMP 01/03/2018 (Exact  Date) Comment: u preg ordered There is no height or weight on file to calculate BMI.   General Appearance:  Alert, cooperative, no distress, appears stated age  Head:  Normocephalic, without obvious abnormality, atraumatic  Eyes:  Conjunctiva clear, EOM's intact  Nose: Nares normal  Throat: Lips, tongue normal; teeth and gums normal, *** posterior oropharnyx  Neck: Supple, symmetrical  Lungs:   ***, Respirations unlabored, no coughing  Heart:  Appears well perfused  Extremities: No edema  Skin: Skin color, texture, turgor normal, no rashes or lesions on visualized portions of skin  Neurologic: No gross deficits     Diagnostics: Spirometry:  Tracings reviewed. Her effort: {Blank single:19197::"Good reproducible efforts.","It was hard to get consistent efforts and there is a question as to whether this reflects a maximal maneuver.","Poor effort, data can not be interpreted."} FVC: ***L FEV1: ***L, ***% predicted FEV1/FVC ratio: ***% Interpretation: {Blank single:19197::"Spirometry consistent with mild obstructive disease","Spirometry consistent with moderate obstructive disease","Spirometry consistent with severe obstructive disease","Spirometry consistent with possible restrictive disease","Spirometry consistent with mixed obstructive and restrictive disease","Spirometry uninterpretable due to technique","Spirometry consistent with normal pattern","No overt abnormalities noted given today's efforts"}.  Please see scanned spirometry results for details.  PROCEDURES:  Patient received the following doses every hour: Step 1:  0.30ml - 1:1,000,000 dilution (silver vial) Step 2:  0.62ml - 1:1,000,000 dilution (silver vial) Step 3: 0.74ml - 1:100,000 dilution (blue vial)  Step 4: 0.27ml - 1:100,000 dilution (blue vial)  Step 5: 0.28ml - 1:10,000 dilution (gold vial) Step 6: 0.2ml - 1:10,000 dilution (gold vial) Step 7: 0.84ml - 1:10,000 dilution (gold vial) Step 8: 0.42ml - 1:10,000 dilution  (gold vial)  Patient was observed for 1 hour after the last dose.   Procedure started at *** Procedure ended at ***   ASSESSMENT/PLAN:   Patient has tolerated the rapid desensitization protocol.  Next appointment: Start at 0.88ml of 1:1000 dilution (green vial) and build up per protocol.

## 2024-02-05 ENCOUNTER — Telehealth: Payer: Self-pay | Admitting: Internal Medicine

## 2024-02-05 ENCOUNTER — Ambulatory Visit: Payer: BC Managed Care – PPO | Admitting: Internal Medicine

## 2024-02-05 NOTE — Telephone Encounter (Signed)
 Patient showed up for rush appointment on 02/05/2024 but we had to cancel because the appointment did not show that this was a rush patient, and we do not have the staff to do it today. Please call patient back to reschedule rush appointment.

## 2024-02-06 NOTE — Telephone Encounter (Signed)
 We had a [facility] immunotherapy cancellation on this Friday 02/09/2024. Called to see if patient would be able to come in as her appointment had to be rescheduled. Unfortunately patient has clinic and will not be able to make it.

## 2024-02-07 DIAGNOSIS — F4310 Post-traumatic stress disorder, unspecified: Secondary | ICD-10-CM | POA: Diagnosis not present

## 2024-03-06 DIAGNOSIS — F4310 Post-traumatic stress disorder, unspecified: Secondary | ICD-10-CM | POA: Diagnosis not present

## 2024-03-09 ENCOUNTER — Emergency Department (HOSPITAL_COMMUNITY)

## 2024-03-09 ENCOUNTER — Other Ambulatory Visit: Payer: Self-pay

## 2024-03-09 ENCOUNTER — Emergency Department (HOSPITAL_COMMUNITY)
Admission: EM | Admit: 2024-03-09 | Discharge: 2024-03-09 | Disposition: A | Discharge status: Home / Self Care | Attending: Emergency Medicine | Admitting: Emergency Medicine

## 2024-03-09 ENCOUNTER — Encounter (HOSPITAL_COMMUNITY): Payer: Self-pay | Admitting: Emergency Medicine

## 2024-03-09 DIAGNOSIS — W274XXA Contact with kitchen utensil, initial encounter: Secondary | ICD-10-CM | POA: Insufficient documentation

## 2024-03-09 DIAGNOSIS — Y92007 Garden or yard of unspecified non-institutional (private) residence as the place of occurrence of the external cause: Secondary | ICD-10-CM | POA: Diagnosis not present

## 2024-03-09 DIAGNOSIS — Z23 Encounter for immunization: Secondary | ICD-10-CM | POA: Insufficient documentation

## 2024-03-09 DIAGNOSIS — S61214A Laceration without foreign body of right ring finger without damage to nail, initial encounter: Secondary | ICD-10-CM | POA: Diagnosis not present

## 2024-03-09 MED ORDER — CEPHALEXIN 500 MG PO CAPS: 500.0000 mg | ORAL_CAPSULE | Freq: Four times a day (QID) | ORAL | 0 refills | Status: AC

## 2024-03-09 MED ORDER — LIDOCAINE HCL (PF) 1 % IJ SOLN
30.0000 mL | Freq: Once | INTRAMUSCULAR | Status: AC
  Administered 2024-03-09: 30 mL
  Filled 2024-03-09: qty 30

## 2024-03-09 MED ORDER — TETANUS-DIPHTH-ACELL PERTUSSIS 5-2.5-18.5 LF-MCG/0.5 IM SUSY
0.5000 mL | PREFILLED_SYRINGE | Freq: Once | INTRAMUSCULAR | Status: AC
  Administered 2024-03-09: 0.5 mL via INTRAMUSCULAR
  Filled 2024-03-09: qty 0.5

## 2024-03-09 NOTE — ED Triage Notes (Signed)
 Pt reports finger laceration 4th digit from cheese grater. Pt missing a large piece of skin and laceration is down to the bone.

## 2024-03-09 NOTE — ED Provider Notes (Signed)
  EMERGENCY DEPARTMENT AT [facility] Provider Note   CSN: [id number] Arrival date & time: 03/09/24  4098     History  Chief Complaint  Patient presents with   Finger Injury    Tracy Knapp is a 33 y.o. female.  HPI 33 year old female presents today complaining of injury to the dorsal aspect of her right fourth finger between the DIP and PIP joint.  She was in the garden when she fell against a grater.  She is missing a piece of tissue.  She is having some pain in the area.  She is able to move it without difficulty.  Last tetanus shot was about 8 years ago.     Home Medications Prior to Admission medications   Medication Sig Start Date End Date Taking? Authorizing Provider  cephALEXin (KEFLEX) 500 MG capsule Take 1 capsule (500 mg total) by mouth 4 (four) times daily. 03/09/24  Yes Auston Blush, MD  acetaminophen  (TYLENOL ) 325 MG tablet Take 325 mg by mouth every 6 (six) hours as needed. 07/30/21   [provider]  albuterol  (PROVENTIL ) (2.5 MG/3ML) 0.083% nebulizer solution Take 2.5 mg by nebulization every 6 (six) hours as needed for wheezing or shortness of breath.    [provider]  albuterol  (PROVENTIL ) (2.5 MG/3ML) 0.083% nebulizer solution Take 3 mLs (2.5 mg total) by nebulization every 6 (six) hours as needed for shortness of breath or wheezing. 11/14/23   Kandice Orleans, MD  albuterol  (VENTOLIN  HFA) 108 (90 Base) MCG/ACT inhaler Inhale 2 puffs into the lungs every 6 (six) hours as needed for shortness of breath or wheezing. 11/14/23   Kandice Orleans, MD  cetirizine  (ZYRTEC  ALLERGY ) 10 MG tablet Take 1 tablet (10 mg total) by mouth daily. 11/21/23   Kandice Orleans, MD  EPINEPHrine  (EPIPEN  2-PAK) 0.3 mg/0.3 mL IJ SOAJ injection Inject 0.3 mg into the muscle as needed for anaphylaxis. 12/22/23   Kandice Orleans, MD  famotidine  (PEPCID ) 20 MG tablet Take 20 mg the morning and evening before and 20 mg the morning and evening of [facility].  01/03/24   Kandice Orleans, MD  fexofenadine (ALLEGRA) 180 MG tablet Take 180 mg by mouth daily. Patient not taking: Reported on 11/14/2023    [provider]  fluticasone  (FLONASE ) 50 MCG/ACT nasal spray Place 2 sprays into both nostrils daily. 11/14/23   Kandice Orleans, MD  fluticasone -salmeterol (ADVAIR HFA) 115-21 MCG/ACT inhaler Inhale 2 puffs into the lungs 2 (two) times daily. 11/14/23   Kandice Orleans, MD  hydrOXYzine (ATARAX/VISTARIL) 25 MG tablet Take 25 mg by mouth 2 (two) times daily. 01/05/20   [provider]  ibuprofen (ADVIL) 600 MG tablet Take 600 mg by mouth every 6 (six) hours as needed. 07/30/21   [provider]  levothyroxine (SYNTHROID, LEVOTHROID) 50 MCG tablet Take 50 mcg by mouth daily before breakfast.    [provider]  metoprolol  tartrate (LOPRESSOR ) 25 MG tablet Take 1/2 tablet ( 12.5 mg ) twice a day as needed for palpitations Patient not taking: Reported on 11/14/2023 05/05/20   Rodriguez-Guzman, Raquel, RPH-CPP  montelukast  (SINGULAIR ) 10 MG tablet Take 1 tablet (10 mg total) by mouth at bedtime. Patient not taking: Reported on 11/14/2023 03/18/20   Jaquita Merl P, DO  montelukast  (SINGULAIR ) 10 MG tablet Take Singulair  10mg  (1 tablet) day prior to [facility] allergy  shot and day of [facility] allergy  shot. 12/22/23   Kandice Orleans, MD  nystatin-triamcinolone [facility] II) cream Apply  1 Application topically as needed.    [provider]  Olopatadine-Mometasone (RYALTRIS ) 665-25 MCG/ACT SUSP Place 2 sprays into the nose in the morning and at bedtime. 11/21/23   Kandice Orleans, MD  predniSONE  (DELTASONE ) 20 MG tablet Take prednisone  40mg  (2 tablets) day prior to [facility] allergy  shot and day of [facility] allergy  shot. 12/22/23   Kandice Orleans, MD  SYMBICORT 80-4.5 MCG/ACT inhaler  03/11/20   [provider]      Allergies    Vicodin [hydrocodone-acetaminophen ]    Review of Systems   Review of Systems  Physical Exam Updated Vital Signs BP 111/79    Pulse 88   Temp (!) 97.5 F (36.4 C) (Oral)   Resp 16   Ht 1.854 m (6\' 1" )   Wt 95.3 kg   LMP 01/03/2018 (Exact Date) Comment: u preg ordered  SpO2 98%   BMI 27.71 kg/m  Physical Exam Vitals and nursing note reviewed.  Constitutional:      General: She is not in acute distress.    Appearance: She is well-developed.  HENT:     Head: Normocephalic and atraumatic.     Right Ear: External ear normal.     Left Ear: External ear normal.     Nose: Nose normal.  Eyes:     Conjunctiva/sclera: Conjunctivae normal.     Pupils: Pupils are equal, round, and reactive to light.  Pulmonary:     Effort: Pulmonary effort is normal.  Musculoskeletal:        General: Normal range of motion.     Cervical back: Normal range of motion and neck supple.     Comments: Picture placed of right hand. Skin defect on dorsal aspect of right fourth finger. Explored through full range of motion and no tendon defect noted, but does appear to be admission tissue down to the tendon.  Skin:    General: Skin is warm and dry.  Neurological:     Mental Status: She is alert and oriented to person, place, and time.     Motor: No abnormal muscle tone.     Coordination: Coordination normal.  Psychiatric:        Behavior: Behavior normal.        Thought Content: Thought content normal.     ED Results / Procedures / Treatments   Labs (all labs ordered are listed, but only abnormal results are displayed) Labs Reviewed - No data to display  EKG None  Radiology DG Finger Ring Right Result Date: 03/09/2024 CLINICAL DATA:  Laceration from cheese grater. EXAM: RIGHT RING FINGER 2+V COMPARISON:  None Available. FINDINGS: There is no evidence of fracture or dislocation. The alignment and joint spaces are preserved. No erosive change. Skin and soft tissue irregularity about the ulnar aspect of the distal interphalangeal joint. No radiopaque foreign body. IMPRESSION: Skin and soft tissue irregularity about the ulnar  aspect of the distal interphalangeal joint. No radiopaque foreign body. No fracture or dislocation. Electronically Signed   By: Chadwick Colonel M.D.   On: 03/09/2024 10:18    Procedures .Nerve Block  Date/Time: 03/09/2024 12:52 PM  Performed by: Auston Blush, MD Authorized by: Auston Blush, MD   Consent:    Consent obtained:  Verbal   Consent given by:  Patient   Risks discussed:  Allergic reaction and infection   Alternatives discussed:  No treatment Universal protocol:    Patient identity confirmed:  Verbally with patient Indications:    Indications:  Procedural anesthesia Location:  Body area:  Upper extremity   Upper extremity nerve:  Metacarpal   Laterality:  Right Pre-procedure details:    Skin preparation:  Alcohol Skin anesthesia:    Skin anesthesia method:  None Procedure details:    Block needle gauge:  24 G   Anesthetic injected:  Lidocaine  1% w/o epi   Steroid injected:  None   Additive injected:  None   Injection procedure:  Anatomic landmarks identified   Paresthesia:  Immediately resolved Post-procedure details:    Dressing:  Sterile dressing   Outcome:  Pain improved   Procedure completion:  Tolerated Comments:     Block placed for exploration of wound Finger flexed and tendon examined through range of motion with no obvious defect noted     Medications Ordered in ED Medications  Tdap (BOOSTRIX) injection 0.5 mL (0.5 mLs Intramuscular Given 03/09/24 1118)  lidocaine  (PF) (XYLOCAINE ) 1 % injection 30 mL (30 mLs Infiltration Given 03/09/24 1118)    ED Course/ Medical Decision Making/ A&P                                 Medical Decision Making Amount and/or Complexity of Data Reviewed Radiology: ordered.  Risk Prescription drug management.   Patient with injury to finger with tissue defect.  Finger with local placed for exploration of wound.  No obvious tendon deficit noted. X-Lumina Gitto obtained with no evidence of acute abnormality. Care  discussed with Prentiss Brocks, on-call for Dr. Delmar Ferrara, on-call for hand surgery. Tetanus updated Advised by consultant to irrigate and place dressing and they will see in follow-up in office on Monday.  Discussed antibiotics with patient and will call in Keflex.         Final Clinical Impression(s) / ED Diagnoses Final diagnoses:  Laceration of right ring finger without foreign body without damage to nail, initial encounter    Rx / DC Orders ED Discharge Orders          Ordered    cephALEXin (KEFLEX) 500 MG capsule  4 times daily        03/09/24 1259              Auston Blush, MD 03/09/24 1314

## 2024-03-09 NOTE — ED Notes (Signed)
 Finger wound irrigated with normal saline. Covered with non stick dressing and gauze. Finger splint applied and secured with tape.

## 2024-03-09 NOTE — ED Notes (Signed)
Pt brought to room from X-ray

## 2024-03-09 NOTE — Discharge Instructions (Addendum)
 Please keep dressing in place You may pick up your antibiotic prescription and begin today Please be at Dr. Stephenie Einstein office at 8 AM on Monday morning.

## 2024-03-11 DIAGNOSIS — S61214A Laceration without foreign body of right ring finger without damage to nail, initial encounter: Secondary | ICD-10-CM | POA: Diagnosis not present

## 2024-03-14 NOTE — Telephone Encounter (Signed)
 Tarita called back and just stated she did not want to do [facility] anymore and scheduled her first appointment for regular immunotherapy.

## 2024-03-26 DIAGNOSIS — F4310 Post-traumatic stress disorder, unspecified: Secondary | ICD-10-CM | POA: Diagnosis not present

## 2024-03-27 DIAGNOSIS — E063 Autoimmune thyroiditis: Secondary | ICD-10-CM | POA: Diagnosis not present

## 2024-03-27 DIAGNOSIS — Z0184 Encounter for antibody response examination: Secondary | ICD-10-CM | POA: Diagnosis not present

## 2024-03-27 DIAGNOSIS — Z13220 Encounter for screening for lipoid disorders: Secondary | ICD-10-CM | POA: Diagnosis not present

## 2024-03-27 DIAGNOSIS — D6851 Activated protein C resistance: Secondary | ICD-10-CM | POA: Diagnosis not present

## 2024-03-27 DIAGNOSIS — J4520 Mild intermittent asthma, uncomplicated: Secondary | ICD-10-CM | POA: Diagnosis not present

## 2024-03-27 DIAGNOSIS — K219 Gastro-esophageal reflux disease without esophagitis: Secondary | ICD-10-CM | POA: Diagnosis not present

## 2024-03-27 DIAGNOSIS — Z0000 Encounter for general adult medical examination without abnormal findings: Secondary | ICD-10-CM | POA: Diagnosis not present

## 2024-04-04 ENCOUNTER — Ambulatory Visit (INDEPENDENT_AMBULATORY_CARE_PROVIDER_SITE_OTHER)

## 2024-04-04 DIAGNOSIS — J309 Allergic rhinitis, unspecified: Secondary | ICD-10-CM

## 2024-04-04 NOTE — Progress Notes (Deleted)
 Tracy Knapp

## 2024-04-04 NOTE — Progress Notes (Signed)
 Immunotherapy   Patient Details  Name: Tracy Knapp MRN: [id number] Date of Birth: [birth year]  04/04/2024  Tracy Knapp started injections for  Tree,Grass,Weed-Dust-Horse & Cat-Mold Following schedule: B  Frequency:1-2x weekly Epi-Pen:Epi-Pen Available  Consent signed and patient instructions given. Patient waited in office 30 minutes with no issues.    Tracy Knapp 04/04/2024, 9:43 AM

## 2024-04-09 ENCOUNTER — Ambulatory Visit (INDEPENDENT_AMBULATORY_CARE_PROVIDER_SITE_OTHER): Payer: Self-pay

## 2024-04-09 DIAGNOSIS — J309 Allergic rhinitis, unspecified: Secondary | ICD-10-CM | POA: Diagnosis not present

## 2024-04-10 DIAGNOSIS — F4310 Post-traumatic stress disorder, unspecified: Secondary | ICD-10-CM | POA: Diagnosis not present

## 2024-04-11 ENCOUNTER — Ambulatory Visit (INDEPENDENT_AMBULATORY_CARE_PROVIDER_SITE_OTHER): Payer: Self-pay

## 2024-04-11 DIAGNOSIS — J309 Allergic rhinitis, unspecified: Secondary | ICD-10-CM | POA: Diagnosis not present

## 2024-04-16 ENCOUNTER — Ambulatory Visit (INDEPENDENT_AMBULATORY_CARE_PROVIDER_SITE_OTHER): Payer: Self-pay

## 2024-04-16 DIAGNOSIS — J309 Allergic rhinitis, unspecified: Secondary | ICD-10-CM

## 2024-04-18 ENCOUNTER — Ambulatory Visit (INDEPENDENT_AMBULATORY_CARE_PROVIDER_SITE_OTHER): Payer: Self-pay

## 2024-04-18 DIAGNOSIS — J309 Allergic rhinitis, unspecified: Secondary | ICD-10-CM

## 2024-04-23 ENCOUNTER — Ambulatory Visit (INDEPENDENT_AMBULATORY_CARE_PROVIDER_SITE_OTHER)

## 2024-04-23 DIAGNOSIS — J309 Allergic rhinitis, unspecified: Secondary | ICD-10-CM | POA: Diagnosis not present

## 2024-04-23 DIAGNOSIS — F4310 Post-traumatic stress disorder, unspecified: Secondary | ICD-10-CM | POA: Diagnosis not present

## 2024-04-25 ENCOUNTER — Ambulatory Visit (INDEPENDENT_AMBULATORY_CARE_PROVIDER_SITE_OTHER): Payer: Self-pay

## 2024-04-25 DIAGNOSIS — J309 Allergic rhinitis, unspecified: Secondary | ICD-10-CM | POA: Diagnosis not present

## 2024-04-30 ENCOUNTER — Ambulatory Visit (INDEPENDENT_AMBULATORY_CARE_PROVIDER_SITE_OTHER): Payer: Self-pay

## 2024-04-30 DIAGNOSIS — J309 Allergic rhinitis, unspecified: Secondary | ICD-10-CM

## 2024-05-02 ENCOUNTER — Ambulatory Visit

## 2024-05-02 DIAGNOSIS — J309 Allergic rhinitis, unspecified: Secondary | ICD-10-CM

## 2024-05-06 ENCOUNTER — Ambulatory Visit (INDEPENDENT_AMBULATORY_CARE_PROVIDER_SITE_OTHER)

## 2024-05-06 DIAGNOSIS — J309 Allergic rhinitis, unspecified: Secondary | ICD-10-CM | POA: Diagnosis not present

## 2024-05-09 ENCOUNTER — Ambulatory Visit (INDEPENDENT_AMBULATORY_CARE_PROVIDER_SITE_OTHER)

## 2024-05-09 DIAGNOSIS — J309 Allergic rhinitis, unspecified: Secondary | ICD-10-CM

## 2024-05-17 ENCOUNTER — Ambulatory Visit (INDEPENDENT_AMBULATORY_CARE_PROVIDER_SITE_OTHER)

## 2024-05-17 DIAGNOSIS — J309 Allergic rhinitis, unspecified: Secondary | ICD-10-CM

## 2024-05-21 ENCOUNTER — Ambulatory Visit (INDEPENDENT_AMBULATORY_CARE_PROVIDER_SITE_OTHER)

## 2024-05-21 DIAGNOSIS — J309 Allergic rhinitis, unspecified: Secondary | ICD-10-CM | POA: Diagnosis not present

## 2024-05-22 DIAGNOSIS — F4310 Post-traumatic stress disorder, unspecified: Secondary | ICD-10-CM | POA: Diagnosis not present

## 2024-05-23 ENCOUNTER — Ambulatory Visit

## 2024-05-23 DIAGNOSIS — J309 Allergic rhinitis, unspecified: Secondary | ICD-10-CM | POA: Diagnosis not present

## 2024-05-30 ENCOUNTER — Ambulatory Visit (INDEPENDENT_AMBULATORY_CARE_PROVIDER_SITE_OTHER)

## 2024-05-30 DIAGNOSIS — J309 Allergic rhinitis, unspecified: Secondary | ICD-10-CM

## 2024-06-04 ENCOUNTER — Ambulatory Visit (INDEPENDENT_AMBULATORY_CARE_PROVIDER_SITE_OTHER)

## 2024-06-04 DIAGNOSIS — J309 Allergic rhinitis, unspecified: Secondary | ICD-10-CM

## 2024-06-05 DIAGNOSIS — F4310 Post-traumatic stress disorder, unspecified: Secondary | ICD-10-CM | POA: Diagnosis not present

## 2024-06-05 DIAGNOSIS — F4320 Adjustment disorder, unspecified: Secondary | ICD-10-CM | POA: Diagnosis not present

## 2024-06-06 ENCOUNTER — Telehealth: Payer: Self-pay

## 2024-06-06 ENCOUNTER — Ambulatory Visit (INDEPENDENT_AMBULATORY_CARE_PROVIDER_SITE_OTHER)

## 2024-06-06 DIAGNOSIS — J309 Allergic rhinitis, unspecified: Secondary | ICD-10-CM | POA: Diagnosis not present

## 2024-06-06 NOTE — Telephone Encounter (Signed)
 Please get the paperwork ready for patient as if she was going to do the injections at outside facility.  Have patient take the vials with her - they do have to be kept cold. Recommend taking in a small cooler with ice/cooler pack. We may need to write a letter so she can carry those things across through [employer].  HOWEVER, she may be better of establishing care with an allergist locally because they will have to mix new vials for her once current vials are finished.   We can NOT ship medications internationally.  She may be done with her current vial sets by the time she leaves - please make a note to NOT mix any more vials.   Thank you.

## 2024-06-06 NOTE — Telephone Encounter (Signed)
 Patient came in to get her allergy  injections and informed me that she just found out that she will be moving to the [place] 08/14/2024. Patient is wanting to take her vials or have them mailed, however patient can not establish care for a few months after she has moved. She has cat and dog in her vials and would like to continue receiving them. Please advise

## 2024-06-07 ENCOUNTER — Telehealth: Payer: Self-pay

## 2024-06-07 NOTE — Telephone Encounter (Signed)
 Patient called and states that she developed a reaction after her injection yesterday around 6:30 pm. Eye redness and swelling, redness and swelling around the injection area on left arm and ears turned really red. She wanted to let you know and ask if she had to be backed down on her dose. She received 0.2 ml of Gold Vial

## 2024-06-07 NOTE — Telephone Encounter (Signed)
 I'm forwarding this message to Dr. Tobie and this is her patient.  I've never seen this patient before. Thank you.

## 2024-06-10 NOTE — Telephone Encounter (Signed)
 Paperwork has been started however we will not be able to complete them until closer to October when she is leaving. Paperwork includes Theme park manager for Epipen  and Inhaler, Acknowledgment Forms, General Allergy  Injection Guidelines, and Treatment Record Log. The [employer] form and the Acknowledgment form will need to be dated and signed, the Guidelines form and record log will need to be completed. The [employer] Orders form does not include allergy  vials and will require a letter. All forms have been placed in an envelope and placed in the injection room in the red accordion folder.

## 2024-06-17 ENCOUNTER — Ambulatory Visit (INDEPENDENT_AMBULATORY_CARE_PROVIDER_SITE_OTHER)

## 2024-06-17 DIAGNOSIS — J309 Allergic rhinitis, unspecified: Secondary | ICD-10-CM

## 2024-06-27 DIAGNOSIS — F4320 Adjustment disorder, unspecified: Secondary | ICD-10-CM | POA: Diagnosis not present

## 2024-06-27 DIAGNOSIS — L810 Postinflammatory hyperpigmentation: Secondary | ICD-10-CM | POA: Diagnosis not present

## 2024-06-27 DIAGNOSIS — F4310 Post-traumatic stress disorder, unspecified: Secondary | ICD-10-CM | POA: Diagnosis not present

## 2024-07-02 DIAGNOSIS — F4320 Adjustment disorder, unspecified: Secondary | ICD-10-CM | POA: Diagnosis not present

## 2024-07-02 DIAGNOSIS — F4310 Post-traumatic stress disorder, unspecified: Secondary | ICD-10-CM | POA: Diagnosis not present

## 2024-07-17 DIAGNOSIS — F4320 Adjustment disorder, unspecified: Secondary | ICD-10-CM | POA: Diagnosis not present

## 2024-07-17 DIAGNOSIS — F4310 Post-traumatic stress disorder, unspecified: Secondary | ICD-10-CM | POA: Diagnosis not present

## 2024-07-17 NOTE — Telephone Encounter (Signed)
 Per Dr. Tobie:  Please schedule this patient with me. She is planning on moving to another country ( I think in October) and needs a follow up prior to that, preferably closer to the time she is moving.   Called patient - DOB/DPR verified - advised of above provider notation.  Patient stated she didn't need a follow up appt since she would not be able to take the vials with her abroad.  Patient advised she will need to complete a discontinuation form - form will be place on [address] side for her to complete and sign.  Patient verbalized understanding to all - will come by the office in the next couple of weeks to complete/sign form.  Reviewed chart w/Beth - patient needs f/u appt to review ingredients in each vial - obtain letters for [employer] and new provider - we do not ship international - patient is moving to [place], [place].  Called patient back - advised of options - cool container and above  notations.  Patient stated she will speak to her wife - didn't know if she would want to have to manage the container with all of their pets too - will contact office or send myChart message if she decides appointment is needed.  Forwarding updated message to provider.

## 2024-07-24 DIAGNOSIS — F4310 Post-traumatic stress disorder, unspecified: Secondary | ICD-10-CM | POA: Diagnosis not present

## 2024-07-24 DIAGNOSIS — F4320 Adjustment disorder, unspecified: Secondary | ICD-10-CM | POA: Diagnosis not present

## 2024-08-07 DIAGNOSIS — F4320 Adjustment disorder, unspecified: Secondary | ICD-10-CM | POA: Diagnosis not present

## 2024-08-07 DIAGNOSIS — F4310 Post-traumatic stress disorder, unspecified: Secondary | ICD-10-CM | POA: Diagnosis not present

## 2024-08-28 ENCOUNTER — Other Ambulatory Visit: Payer: Self-pay | Admitting: Internal Medicine
